# Patient Record
Sex: Female | Born: 1950 | Race: White | Hispanic: No | Marital: Married | Smoking: Former smoker
Health system: Southern US, Community
[De-identification: ages and names within clinical notes are randomized; demographics above are authoritative.]

## PROBLEM LIST (undated history)

## (undated) DIAGNOSIS — F32A Depression, unspecified: Secondary | ICD-10-CM

## (undated) DIAGNOSIS — J449 Chronic obstructive pulmonary disease, unspecified: Secondary | ICD-10-CM

## (undated) DIAGNOSIS — I10 Essential (primary) hypertension: Secondary | ICD-10-CM

## (undated) DIAGNOSIS — J189 Pneumonia, unspecified organism: Secondary | ICD-10-CM

## (undated) DIAGNOSIS — R001 Bradycardia, unspecified: Secondary | ICD-10-CM

## (undated) DIAGNOSIS — M1990 Unspecified osteoarthritis, unspecified site: Secondary | ICD-10-CM

## (undated) DIAGNOSIS — F419 Anxiety disorder, unspecified: Secondary | ICD-10-CM

## (undated) DIAGNOSIS — G629 Polyneuropathy, unspecified: Secondary | ICD-10-CM

## (undated) DIAGNOSIS — Z8719 Personal history of other diseases of the digestive system: Secondary | ICD-10-CM

## (undated) DIAGNOSIS — I2510 Atherosclerotic heart disease of native coronary artery without angina pectoris: Secondary | ICD-10-CM

## (undated) DIAGNOSIS — E11319 Type 2 diabetes mellitus with unspecified diabetic retinopathy without macular edema: Secondary | ICD-10-CM

## (undated) DIAGNOSIS — E119 Type 2 diabetes mellitus without complications: Secondary | ICD-10-CM

## (undated) DIAGNOSIS — K589 Irritable bowel syndrome without diarrhea: Secondary | ICD-10-CM

## (undated) DIAGNOSIS — I5042 Chronic combined systolic (congestive) and diastolic (congestive) heart failure: Secondary | ICD-10-CM

## (undated) DIAGNOSIS — F329 Major depressive disorder, single episode, unspecified: Secondary | ICD-10-CM

## (undated) DIAGNOSIS — E669 Obesity, unspecified: Secondary | ICD-10-CM

## (undated) DIAGNOSIS — K219 Gastro-esophageal reflux disease without esophagitis: Secondary | ICD-10-CM

## (undated) DIAGNOSIS — E039 Hypothyroidism, unspecified: Secondary | ICD-10-CM

## (undated) DIAGNOSIS — N200 Calculus of kidney: Secondary | ICD-10-CM

## (undated) DIAGNOSIS — D509 Iron deficiency anemia, unspecified: Secondary | ICD-10-CM

## (undated) DIAGNOSIS — R1011 Right upper quadrant pain: Secondary | ICD-10-CM

## (undated) DIAGNOSIS — Z941 Heart transplant status: Secondary | ICD-10-CM

## (undated) HISTORY — DX: Type 2 diabetes mellitus without complications: E11.9

## (undated) HISTORY — DX: Atherosclerotic heart disease of native coronary artery without angina pectoris: I25.10

## (undated) HISTORY — DX: Chronic combined systolic (congestive) and diastolic (congestive) heart failure: I50.42

## (undated) HISTORY — PX: COLONOSCOPY: SHX174

## (undated) HISTORY — PX: EYE SURGERY: SHX253

## (undated) HISTORY — DX: Essential (primary) hypertension: I10

## (undated) HISTORY — DX: Obesity, unspecified: E66.9

## (undated) HISTORY — PX: CHOLECYSTECTOMY: SHX55

## (undated) HISTORY — PX: APPENDECTOMY: SHX54

## (undated) HISTORY — DX: Anxiety disorder, unspecified: F41.9

## (undated) HISTORY — DX: Gastro-esophageal reflux disease without esophagitis: K21.9

## (undated) HISTORY — DX: Iron deficiency anemia, unspecified: D50.9

## (undated) HISTORY — DX: Right upper quadrant pain: R10.11

## (undated) HISTORY — DX: Bradycardia, unspecified: R00.1

---

## 1992-12-08 HISTORY — PX: OTHER SURGICAL HISTORY: SHX169

## 1996-12-08 HISTORY — PX: OTHER SURGICAL HISTORY: SHX169

## 1996-12-08 HISTORY — PX: CARDIAC CATHETERIZATION: SHX172

## 1996-12-08 HISTORY — PX: BILATERAL SALPINGOOPHORECTOMY: SHX1223

## 1998-06-02 ENCOUNTER — Emergency Department (HOSPITAL_COMMUNITY): Admission: EM | Admit: 1998-06-02 | Discharge: 1998-06-02 | Payer: Self-pay | Admitting: Emergency Medicine

## 1999-01-15 ENCOUNTER — Inpatient Hospital Stay (HOSPITAL_COMMUNITY): Admission: AD | Admit: 1999-01-15 | Discharge: 1999-01-16 | Payer: Self-pay | Admitting: Cardiovascular Disease

## 1999-01-16 ENCOUNTER — Encounter: Payer: Self-pay | Admitting: Cardiovascular Disease

## 1999-04-26 ENCOUNTER — Inpatient Hospital Stay (HOSPITAL_COMMUNITY): Admission: AD | Admit: 1999-04-26 | Discharge: 1999-04-27 | Payer: Self-pay | Admitting: Cardiology

## 2002-12-22 ENCOUNTER — Encounter: Admission: RE | Admit: 2002-12-22 | Discharge: 2002-12-22 | Payer: Self-pay | Admitting: Internal Medicine

## 2002-12-22 ENCOUNTER — Encounter: Payer: Self-pay | Admitting: Internal Medicine

## 2002-12-27 ENCOUNTER — Encounter: Admission: RE | Admit: 2002-12-27 | Discharge: 2002-12-27 | Payer: Self-pay | Admitting: Internal Medicine

## 2002-12-27 ENCOUNTER — Encounter (INDEPENDENT_AMBULATORY_CARE_PROVIDER_SITE_OTHER): Payer: Self-pay | Admitting: *Deleted

## 2002-12-27 ENCOUNTER — Encounter: Payer: Self-pay | Admitting: Internal Medicine

## 2004-10-02 ENCOUNTER — Inpatient Hospital Stay (HOSPITAL_COMMUNITY): Admission: EM | Admit: 2004-10-02 | Discharge: 2004-10-09 | Payer: Self-pay | Admitting: Cardiovascular Disease

## 2004-10-02 ENCOUNTER — Ambulatory Visit: Payer: Self-pay | Admitting: *Deleted

## 2004-10-08 ENCOUNTER — Encounter: Payer: Self-pay | Admitting: Cardiology

## 2004-10-08 HISTORY — PX: OTHER SURGICAL HISTORY: SHX169

## 2004-10-19 ENCOUNTER — Ambulatory Visit: Payer: Self-pay | Admitting: Cardiovascular Disease

## 2004-10-19 ENCOUNTER — Inpatient Hospital Stay (HOSPITAL_COMMUNITY): Admission: EM | Admit: 2004-10-19 | Discharge: 2004-10-23 | Payer: Self-pay | Admitting: *Deleted

## 2004-10-19 ENCOUNTER — Ambulatory Visit: Payer: Self-pay | Admitting: Cardiology

## 2004-10-21 ENCOUNTER — Encounter: Payer: Self-pay | Admitting: Cardiology

## 2004-11-04 ENCOUNTER — Ambulatory Visit: Payer: Self-pay | Admitting: Internal Medicine

## 2004-11-22 ENCOUNTER — Ambulatory Visit: Payer: Self-pay | Admitting: Cardiovascular Disease

## 2005-01-06 ENCOUNTER — Ambulatory Visit: Payer: Self-pay

## 2005-02-26 ENCOUNTER — Ambulatory Visit: Payer: Self-pay | Admitting: Cardiovascular Disease

## 2005-04-09 ENCOUNTER — Ambulatory Visit: Payer: Self-pay | Admitting: Internal Medicine

## 2005-04-09 ENCOUNTER — Ambulatory Visit: Payer: Self-pay

## 2005-04-22 ENCOUNTER — Ambulatory Visit: Payer: Self-pay

## 2005-04-23 ENCOUNTER — Ambulatory Visit: Payer: Self-pay | Admitting: Physician Assistant

## 2005-04-25 ENCOUNTER — Ambulatory Visit: Payer: Self-pay | Admitting: Cardiology

## 2005-04-25 ENCOUNTER — Ambulatory Visit (HOSPITAL_COMMUNITY): Admission: RE | Admit: 2005-04-25 | Discharge: 2005-04-26 | Payer: Self-pay | Admitting: Cardiology

## 2005-05-12 ENCOUNTER — Ambulatory Visit: Payer: Self-pay | Admitting: Cardiology

## 2005-06-02 ENCOUNTER — Ambulatory Visit: Payer: Self-pay | Admitting: Internal Medicine

## 2005-06-02 ENCOUNTER — Ambulatory Visit: Payer: Self-pay | Admitting: Cardiovascular Disease

## 2005-06-16 ENCOUNTER — Ambulatory Visit: Payer: Self-pay | Admitting: Cardiology

## 2005-09-01 ENCOUNTER — Ambulatory Visit: Payer: Self-pay | Admitting: Internal Medicine

## 2005-09-01 ENCOUNTER — Ambulatory Visit: Payer: Self-pay | Admitting: Cardiovascular Disease

## 2005-09-29 ENCOUNTER — Ambulatory Visit: Payer: Self-pay | Admitting: Cardiology

## 2005-11-25 ENCOUNTER — Ambulatory Visit: Payer: Self-pay

## 2005-12-30 ENCOUNTER — Ambulatory Visit: Payer: Self-pay | Admitting: *Deleted

## 2006-03-16 ENCOUNTER — Ambulatory Visit: Payer: Self-pay | Admitting: Cardiovascular Disease

## 2006-04-14 ENCOUNTER — Ambulatory Visit: Payer: Self-pay

## 2006-04-14 ENCOUNTER — Ambulatory Visit: Payer: Self-pay | Admitting: Cardiovascular Disease

## 2006-04-14 ENCOUNTER — Encounter: Payer: Self-pay | Admitting: Cardiovascular Disease

## 2007-01-19 ENCOUNTER — Ambulatory Visit: Payer: Self-pay | Admitting: Internal Medicine

## 2007-01-19 LAB — CONVERTED CEMR LAB
ALT: 21 units/L (ref 0–40)
AST: 23 units/L (ref 0–37)
Alkaline Phosphatase: 87 units/L (ref 39–117)
BUN: 12 mg/dL (ref 6–23)
Basophils Relative: 1.1 % — ABNORMAL HIGH (ref 0.0–1.0)
CO2: 31 meq/L (ref 19–32)
Creatinine, Ser: 0.8 mg/dL (ref 0.4–1.2)
Direct LDL: 140 mg/dL
HCT: 38.7 % (ref 36.0–46.0)
Hemoglobin: 13.3 g/dL (ref 12.0–15.0)
Microalb, Ur: 0.8 mg/dL (ref 0.0–1.9)
Monocytes Absolute: 0.4 10*3/uL (ref 0.2–0.7)
Monocytes Relative: 5.9 % (ref 3.0–11.0)
Potassium: 3.5 meq/L (ref 3.5–5.1)
RDW: 11.9 % (ref 11.5–14.6)
Total Bilirubin: 0.8 mg/dL (ref 0.3–1.2)
Total Protein: 6.7 g/dL (ref 6.0–8.3)

## 2007-03-08 ENCOUNTER — Ambulatory Visit: Payer: Self-pay | Admitting: Cardiovascular Disease

## 2007-03-08 ENCOUNTER — Ambulatory Visit: Payer: Self-pay | Admitting: Internal Medicine

## 2007-03-26 ENCOUNTER — Ambulatory Visit: Payer: Self-pay

## 2007-06-03 ENCOUNTER — Ambulatory Visit: Payer: Self-pay

## 2007-07-14 ENCOUNTER — Encounter: Payer: Self-pay | Admitting: Internal Medicine

## 2007-09-21 ENCOUNTER — Encounter: Payer: Self-pay | Admitting: Internal Medicine

## 2008-01-21 ENCOUNTER — Encounter: Payer: Self-pay | Admitting: Internal Medicine

## 2008-04-03 ENCOUNTER — Ambulatory Visit: Payer: Self-pay

## 2008-04-03 ENCOUNTER — Ambulatory Visit: Payer: Self-pay | Admitting: Cardiovascular Disease

## 2008-05-18 ENCOUNTER — Telehealth: Payer: Self-pay | Admitting: Internal Medicine

## 2008-06-20 ENCOUNTER — Telehealth: Payer: Self-pay | Admitting: Internal Medicine

## 2008-06-26 ENCOUNTER — Ambulatory Visit: Payer: Self-pay | Admitting: Internal Medicine

## 2008-06-26 DIAGNOSIS — E785 Hyperlipidemia, unspecified: Secondary | ICD-10-CM | POA: Insufficient documentation

## 2008-06-26 DIAGNOSIS — F411 Generalized anxiety disorder: Secondary | ICD-10-CM | POA: Insufficient documentation

## 2008-06-26 DIAGNOSIS — I2510 Atherosclerotic heart disease of native coronary artery without angina pectoris: Secondary | ICD-10-CM | POA: Insufficient documentation

## 2008-06-26 DIAGNOSIS — K219 Gastro-esophageal reflux disease without esophagitis: Secondary | ICD-10-CM

## 2008-06-26 DIAGNOSIS — J309 Allergic rhinitis, unspecified: Secondary | ICD-10-CM | POA: Insufficient documentation

## 2008-06-26 DIAGNOSIS — I10 Essential (primary) hypertension: Secondary | ICD-10-CM | POA: Insufficient documentation

## 2008-06-26 DIAGNOSIS — E119 Type 2 diabetes mellitus without complications: Secondary | ICD-10-CM

## 2008-06-28 LAB — CONVERTED CEMR LAB
ALT: 18 units/L (ref 0–35)
AST: 18 units/L (ref 0–37)
Basophils Absolute: 0.1 10*3/uL (ref 0.0–0.1)
Bilirubin, Direct: 0.1 mg/dL (ref 0.0–0.3)
CO2: 27 meq/L (ref 19–32)
Calcium: 9.3 mg/dL (ref 8.4–10.5)
Chloride: 99 meq/L (ref 96–112)
Cholesterol: 190 mg/dL (ref 0–200)
Direct LDL: 124 mg/dL
Eosinophils Absolute: 0.2 10*3/uL (ref 0.0–0.7)
GFR calc non Af Amer: 69 mL/min
HDL: 39.7 mg/dL (ref >39.0)
Hgb A1c MFr Bld: 11.5 % — ABNORMAL HIGH (ref 4.6–6.0)
Lymphocytes Relative: 14.2 % (ref 12.0–46.0)
MCHC: 34.5 g/dL (ref 30.0–36.0)
Microalb, Ur: 2.6 mg/dL — ABNORMAL HIGH (ref 0.0–1.9)
Neutrophils Relative %: 75.6 % (ref 43.0–77.0)
RDW: 12.4 % (ref 11.5–14.6)
Sodium: 136 meq/L (ref 135–145)
TSH: 1.81 microintl units/mL (ref 0.35–5.50)
Total Bilirubin: 1 mg/dL (ref 0.3–1.2)
Triglycerides: 231 mg/dL (ref 0–149)
VLDL: 46 mg/dL — ABNORMAL HIGH (ref 0–40)

## 2008-06-29 ENCOUNTER — Telehealth: Payer: Self-pay | Admitting: Internal Medicine

## 2008-06-30 ENCOUNTER — Telehealth: Payer: Self-pay | Admitting: Internal Medicine

## 2008-08-07 ENCOUNTER — Encounter: Payer: Self-pay | Admitting: Internal Medicine

## 2008-10-12 ENCOUNTER — Telehealth: Payer: Self-pay | Admitting: Internal Medicine

## 2008-10-31 ENCOUNTER — Encounter: Payer: Self-pay | Admitting: Internal Medicine

## 2009-02-21 DIAGNOSIS — R609 Edema, unspecified: Secondary | ICD-10-CM | POA: Insufficient documentation

## 2009-09-21 ENCOUNTER — Encounter: Payer: Self-pay | Admitting: Internal Medicine

## 2010-01-21 ENCOUNTER — Telehealth: Payer: Self-pay | Admitting: Internal Medicine

## 2010-02-08 ENCOUNTER — Emergency Department (HOSPITAL_COMMUNITY): Admission: EM | Admit: 2010-02-08 | Discharge: 2010-02-08 | Payer: Self-pay | Admitting: Emergency Medicine

## 2010-02-08 ENCOUNTER — Telehealth: Payer: Self-pay | Admitting: Internal Medicine

## 2010-02-22 ENCOUNTER — Ambulatory Visit: Payer: Self-pay | Admitting: Internal Medicine

## 2010-02-22 LAB — CONVERTED CEMR LAB
AST: 14 units/L (ref 0–37)
Albumin: 4.1 g/dL (ref 3.5–5.2)
Alkaline Phosphatase: 95 units/L (ref 39–117)
Basophils Absolute: 0.1 10*3/uL (ref 0.0–0.1)
Basophils Relative: 1 % (ref 0–1)
Bilirubin, Direct: 0.1 mg/dL (ref 0.0–0.3)
Calcium: 9.3 mg/dL (ref 8.4–10.5)
Glucose, Bld: 204 mg/dL — ABNORMAL HIGH (ref 70–99)
Hemoglobin: 12.3 g/dL (ref 12.0–15.0)
Hgb A1c MFr Bld: 10.7 % — ABNORMAL HIGH (ref 4.6–6.1)
Indirect Bilirubin: 0.5 mg/dL (ref 0.0–0.9)
Lymphocytes Relative: 23 % (ref 12–46)
MCHC: 32.6 g/dL (ref 30.0–36.0)
Monocytes Absolute: 0.4 10*3/uL (ref 0.1–1.0)
Neutro Abs: 4.4 10*3/uL (ref 1.7–7.7)
Neutrophils Relative %: 68 % (ref 43–77)
Potassium: 3.8 meq/L (ref 3.5–5.3)
RDW: 13.5 % (ref 11.5–15.5)
Sodium: 138 meq/L (ref 135–145)
Total Bilirubin: 0.6 mg/dL (ref 0.3–1.2)

## 2010-02-26 ENCOUNTER — Telehealth: Payer: Self-pay | Admitting: Internal Medicine

## 2010-03-01 ENCOUNTER — Telehealth: Payer: Self-pay | Admitting: Internal Medicine

## 2010-05-16 ENCOUNTER — Telehealth: Payer: Self-pay

## 2010-05-24 ENCOUNTER — Telehealth: Payer: Self-pay | Admitting: Internal Medicine

## 2010-10-22 ENCOUNTER — Telehealth: Payer: Self-pay | Admitting: Internal Medicine

## 2010-12-08 HISTORY — PX: CORONARY ARTERY BYPASS GRAFT: SHX141

## 2010-12-20 ENCOUNTER — Encounter: Payer: Self-pay | Admitting: Cardiovascular Disease

## 2010-12-23 ENCOUNTER — Encounter: Payer: Self-pay | Admitting: Cardiovascular Disease

## 2010-12-23 ENCOUNTER — Encounter: Payer: Self-pay | Admitting: Internal Medicine

## 2010-12-23 ENCOUNTER — Ambulatory Visit: Admission: RE | Admit: 2010-12-23 | Discharge: 2010-12-23 | Payer: Self-pay | Source: Home / Self Care

## 2010-12-24 ENCOUNTER — Encounter: Payer: Self-pay | Admitting: Cardiovascular Disease

## 2010-12-26 ENCOUNTER — Inpatient Hospital Stay (HOSPITAL_COMMUNITY)
Admission: AD | Admit: 2010-12-26 | Discharge: 2011-01-05 | Payer: Self-pay | Attending: Cardiothoracic Surgery | Admitting: Cardiothoracic Surgery

## 2010-12-30 LAB — CARDIAC PANEL(CRET KIN+CKTOT+MB+TROPI)
CK, MB: 2 ng/mL (ref 0.3–4.0)
Relative Index: INVALID (ref 0.0–2.5)
Relative Index: INVALID (ref 0.0–2.5)
Total CK: 42 U/L (ref 7–177)
Total CK: 37 U/L (ref 7–177)
Troponin I: 0.03 ng/mL (ref 0.00–0.06)

## 2010-12-30 LAB — HEMOGLOBIN A1C
Hgb A1c MFr Bld: 10.5 % — ABNORMAL HIGH (ref <5.7)
Mean Plasma Glucose: 255 mg/dL — ABNORMAL HIGH (ref <117)

## 2010-12-30 LAB — CBC
HCT: 36.3 % (ref 36.0–46.0)
Hemoglobin: 11.8 g/dL — ABNORMAL LOW (ref 12.0–15.0)
MCH: 27.6 pg (ref 26.0–34.0)
MCHC: 32.5 g/dL (ref 30.0–36.0)
MCV: 84.8 fL (ref 78.0–100.0)
Platelets: 279 10*3/uL (ref 150–400)
RBC: 4.28 MIL/uL (ref 3.87–5.11)
RDW: 13.5 % (ref 11.5–15.5)
WBC: 6.8 10*3/uL (ref 4.0–10.5)

## 2010-12-30 LAB — BASIC METABOLIC PANEL
BUN: 3 mg/dL — ABNORMAL LOW (ref 6–23)
Calcium: 9.1 mg/dL (ref 8.4–10.5)
Chloride: 103 mEq/L (ref 96–112)
Creatinine, Ser: 0.83 mg/dL (ref 0.4–1.2)
GFR calc Af Amer: >60 mL/min (ref >60)
GFR calc non Af Amer: >60 mL/min (ref >60)
Glucose, Bld: 129 mg/dL — ABNORMAL HIGH (ref 70–99)
Potassium: 4 mEq/L (ref 3.5–5.1)
Sodium: 138 mEq/L (ref 135–145)

## 2010-12-30 LAB — LIPID PANEL
Cholesterol: 190 mg/dL (ref 0–200)
HDL: 38 mg/dL — ABNORMAL LOW (ref >39)
LDL Cholesterol: 108 mg/dL — ABNORMAL HIGH (ref 0–99)
Total CHOL/HDL Ratio: 5 RATIO
Triglycerides: 222 mg/dL — ABNORMAL HIGH (ref <150)

## 2010-12-30 LAB — HEPARIN LEVEL (UNFRACTIONATED)
Heparin Unfractionated: 0.24 IU/mL — ABNORMAL LOW (ref 0.30–0.70)
Heparin Unfractionated: 0.12 IU/mL — ABNORMAL LOW (ref 0.30–0.70)
Heparin Unfractionated: 0.16 IU/mL — ABNORMAL LOW (ref 0.30–0.70)

## 2010-12-30 LAB — GLUCOSE, CAPILLARY
Glucose-Capillary: 256 mg/dL — ABNORMAL HIGH (ref 70–99)
Glucose-Capillary: 157 mg/dL — ABNORMAL HIGH (ref 70–99)
Glucose-Capillary: 288 mg/dL — ABNORMAL HIGH (ref 70–99)
Glucose-Capillary: 141 mg/dL — ABNORMAL HIGH (ref 70–99)
Glucose-Capillary: 98 mg/dL (ref 70–99)
Glucose-Capillary: 238 mg/dL — ABNORMAL HIGH (ref 70–99)

## 2010-12-30 LAB — PLATELET INHIBITION P2Y12
P2Y12 % Inhibition: 13 %
Platelet Function  P2Y12: 268 [PRU] (ref 194–418)
Platelet Function Baseline: 307 [PRU] (ref 194–418)

## 2010-12-30 NOTE — H&P (Signed)
NAME:  Shelby Lyons, Shelby Lyons NO.:  [id number]  MEDICAL RECORD NO.:  [id number]          PATIENT TYPE:  INP  LOCATION:  2914                         FACILITY:  [facility]  PHYSICIAN:  Luis Abed, MD, FACCDATE OF BIRTH:  [birth year]  DATE OF ADMISSION:  12/26/2010 DATE OF DISCHARGE:                             HISTORY & PHYSICAL   PRIMARY CARDIOLOGIST:  Theron Arista C. Eden Emms, MD, [facility]  PRIMARY CARE PHYSICIAN:  Gordy Savers, MD  CHIEF COMPLAINT:  Chest pain.  PATIENT PROFILE:  This is a 60 year old female with history of coronary artery disease status post multiple percutaneous coronary interventions, hypertension, hyperlipidemia and insulin-dependent diabetes mellitus who presented to [facility] with complaints of chest pain and questionable EKG changes who has now been transferred to [facility] for possible cardiac catheterization.  PAST MEDICAL HISTORY: 1. Coronary artery disease status post anterior wall myocardial     infarction in 1994 with a stent to the proximal LAD.     a.     In October 2005 status post lateral myocardial infarction      with a drug-eluting stent to the mid circumflex artery with a left      distal dissection and subsequent elective stenting of the distal      RCA with tandem overlying Taxus stent in the mid and proximal      right coronary artery.     b.     Cardiac catheterization in November 2005, status post      intervention of the circumflex artery at the site of dissection as      well as 2 bare-metal stents placed to distal and overlapping the      drug-eluting stent in the distal circumflex artery.     c.     Myoview in 2009 showing an ejection fraction of 47% with      thick anterior apical wall infarction. 2. 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 Hypertension. 4. Hyperlipidemia. 5. Systolic congestive heart failure, last ejection fraction per     echocardiogram in 2007 showing LVEF of 45%. 6.  Gastroesophageal reflux disease. 7. Obesity. 8. Anxiety. 9. Status post appendectomy. 10.Status post hysterectomy and bilateral salpingo-oophorectomy.  HISTORY OF PRESENT ILLNESS:  This is a 60 year old Caucasian female with multiple medical problems as listed above who states she has been in her usual state of health until early this morning.  At this time she presented to [facility] with complaints of chest pain.  The patient states that she was not doing anything when she began to have retrosternal chest pressure and soreness around 1:30 a.m.  She felt initially that she was coming down with bronchitis.  This then progressed into a burning sensation.  They remained retrosternal.  She had no associated symptoms of nausea/vomiting, diaphoresis or shortness of breath.  The patient states the pain did feel similar to her prior myocardial infarction.  Therefore, she took 2 sublingual nitroglycerin without relief.  She then went to the emergency department at [facility] for further evaluation.  In the emergency room, the patient was found to have possible ST depressions on her initial  EKG.  These normalized after placement of a nitroglycerin patch.  Dr. Myrtis Ser was contacted and with EKG changes in the patient's past medical history, she was transferred to [facility] for possible cardiac catheterization.  The patient's burning sensation has cleared since placement of nitroglycerin patch.  She now complains of intermittent stabbing sensation in the left chest.  This only lasts momentarily.  She denies any fever, chills or headaches.  She does have dyspnea on exertion.  She also complains of some orthopnea.  Of note, the patient has not been evaluated by cardiologist since 2009. She states that she has a nonhealing ulcer on her foot that she was recently had ABIs for evaluation.  She states she was going to call for followup appointment but this has not occurred.  SOCIAL HISTORY:   The patient lives in [place] with her husband and her son and his family.  She is a retired Geophysicist/field seismologist.  She denies any tobacco, alcohol or illicit drug use.  She has no exercise program.  FAMILY HISTORY:  Significant for coronary artery disease and myocardial infarction in her father who passed away at the age of 22.  She has a sister with hypertension.  ALLERGIES: 1. SULFA. 2. PENICILLIN.  HOME MEDICATIONS: 1. Aspirin 325 mg daily. 2. Albuterol inhaler 2 puffs every 4 hours as needed for shortness of     breath. 3. Humalog 20 units subcutaneously 3 times a day as needed with meals. 4. Lantus 70 units daily at bedtime. 5. Accupril 20 mg 1 tablet daily. 6. Metformin 1000 mg 1 tablet twice daily. 7. Xanax 0.5 mg 1 tablet daily as needed for anxiety. 8. Atorvastatin 80 mg daily at bedtime. 9. Nitroglycerin sublingual 0.4 mg 1 tablet sublingual every 5 minutes     as needed up to 3 doses for chest pain. 10.Toprol-XL 25 mg half tablet twice daily. 11.Plavix 75 mg 1 tablet daily. 12.Zantac 150 mg 1 tablet daily. 13.Potassium chloride 20 mEq 1 tablet daily. 14.Lasix 40 mg 1 tablet twice daily. 15.Reglan 10 mg 1 tablet twice daily. 16.Zoloft 100 mg 1 tablet daily.  REVIEW OF SYSTEMS:  All pertinent positives and negatives as stated in HPI.  All other systems have been reviewed and are negative.  CODE STATUS:  Full.  PHYSICAL EXAMINATION:  VITAL SIGNS:  Pulse 62, respirations 12, blood pressure 120/52, O2 saturation 98% on 2 L. GENERAL:  This is a well-developed, well-nourished polite middle-aged female.  She is in no acute distress. HEENT:  Normal. NECK:  Supple without bruit or JVD. HEART:  Regular rate and rhythm with S1 and S2.  No murmur, rub or gallop noted.  PMI is normal.  Pulses are 2+ and equal bilaterally. LUNGS:  Clear to auscultation bilaterally without wheezes, rales or rhonchi. ABDOMEN:  Obese, nontender, positive bowel sounds x4. EXTREMITIES:  No  clubbing, cyanosis or edema. MUSCULOSKELETAL:  No joint deformities or effusions. NEURO:  Alert and oriented x3.  Cranial nerves II through XII are grossly intact.  EKG at 4 a.m. demonstrating normal sinus rhythm at a rate of 72 beats per minute with anterior septal infarct as well as questionable inferior ST depressions in that appears to J-point lateral elevations.  Of note, the EKG appears noisy.  Followup EKG at 6:00 a.m. demonstrating normal sinus rhythm with normalization of ST depression and J-point elevation. There still has anterior septal infarct noted.  Labs at [place]; WBC 6.7, hemoglobin 12.6, hematocrit 37.7.  Sodium 133, potassium 4.5, chloride 98, bicarb 22, BUN  12, creatinine 0.74, glucose 374.  Point-of-care markers negative x1.  ASSESSMENT AND PLAN:  This is a 60 year old female with coronary artery disease status post multiple percutaneous coronary interventions, insulin-dependent diabetes mellitus, hypertension, hyperlipidemia, and obesity who now presents with chest pain and EKG changes.  With the patient's presentation, EKG changes and prior history, we will proceed with cardiac catheterization to reevaluate coronary anatomy.  Risks, benefits and indications have been discussed with the patient and her husband and they agree to proceed.  The patient has been placed on the cath scheduled for this afternoon.  She will be continued on heparin, nitroglycerin patch, aspirin, Plavix, beta-blocker, ACE inhibitor and statins for her coronary artery disease.  If the patient's cardiac enzymes do come back positive or further chest pain arrives this weekend, initiate a nitroglycerin drip at that time.  Routine followup and medication compliance have been discussed with the patient and her husband, they voiced understanding.  Further treatment will be dependent on the results of the catheterization.     Leonette Monarch, PA-C   ______________________________ Luis Abed, MD, [facility]    NB/MEDQ  D:  12/26/2010  T:  12/26/2010  Job:  [id number]  cc:   Noralyn Pick. Eden Emms, MD, [facility] Gordy Savers, MD  Electronically Signed by Alen Blew P.A. on 12/30/2010 02:39:46 PM Electronically Signed by Willa Rough MD FACC on 12/30/2010 05:20:55 PM

## 2010-12-31 LAB — PREPARE RBC (CROSSMATCH)

## 2010-12-31 LAB — GLUCOSE, CAPILLARY
Glucose-Capillary: 157 mg/dL — ABNORMAL HIGH (ref 70–99)
Glucose-Capillary: 173 mg/dL — ABNORMAL HIGH (ref 70–99)
Glucose-Capillary: 290 mg/dL — ABNORMAL HIGH (ref 70–99)
Glucose-Capillary: 318 mg/dL — ABNORMAL HIGH (ref 70–99)

## 2010-12-31 LAB — BLOOD GAS, ARTERIAL
Acid-Base Excess: 1.1 mmol/L (ref 0.0–2.0)
Bicarbonate: 24.8 mEq/L — ABNORMAL HIGH (ref 20.0–24.0)
FIO2: 0.21 %
O2 Saturation: 94.9 %
Patient temperature: 98.6
TCO2: 25.9 mmol/L (ref 0–100)
pCO2 arterial: 36.5 mmHg (ref 35.0–45.0)
pH, Arterial: 7.446 — ABNORMAL HIGH (ref 7.350–7.400)
pO2, Arterial: 80.2 mmHg (ref 80.0–100.0)

## 2010-12-31 LAB — COMPREHENSIVE METABOLIC PANEL
ALT: 10 U/L (ref 0–35)
AST: 15 U/L (ref 0–37)
CO2: 25 mEq/L (ref 19–32)
Calcium: 8.9 mg/dL (ref 8.4–10.5)
GFR calc Af Amer: >60 mL/min (ref >60)
Sodium: 137 mEq/L (ref 135–145)
Total Protein: 6.3 g/dL (ref 6.0–8.3)

## 2010-12-31 LAB — URINALYSIS, ROUTINE W REFLEX MICROSCOPIC
Hgb urine dipstick: NEGATIVE
Nitrite: NEGATIVE
Protein, ur: NEGATIVE mg/dL
Urobilinogen, UA: 1 mg/dL (ref 0.0–1.0)

## 2010-12-31 LAB — CBC
HCT: 32.5 % — ABNORMAL LOW (ref 36.0–46.0)
HCT: 36.1 % (ref 36.0–46.0)
HCT: 33.7 % — ABNORMAL LOW (ref 36.0–46.0)
HCT: 33.3 % — ABNORMAL LOW (ref 36.0–46.0)
Hemoglobin: 11.3 g/dL — ABNORMAL LOW (ref 12.0–15.0)
Hemoglobin: 10.9 g/dL — ABNORMAL LOW (ref 12.0–15.0)
MCH: 27.5 pg (ref 26.0–34.0)
MCHC: 32.9 g/dL (ref 30.0–36.0)
MCHC: 32.7 g/dL (ref 30.0–36.0)
MCV: 85.5 fL (ref 78.0–100.0)
MCV: 85.5 fL (ref 78.0–100.0)
Platelets: 230 10*3/uL (ref 150–400)
RBC: 3.8 MIL/uL — ABNORMAL LOW (ref 3.87–5.11)
RBC: 3.99 MIL/uL (ref 3.87–5.11)
RDW: 13.4 % (ref 11.5–15.5)
RDW: 13.4 % (ref 11.5–15.5)
WBC: 5.4 10*3/uL (ref 4.0–10.5)
WBC: 6.4 10*3/uL (ref 4.0–10.5)
WBC: 7.5 10*3/uL (ref 4.0–10.5)

## 2010-12-31 LAB — HEPARIN LEVEL (UNFRACTIONATED)
Heparin Unfractionated: 0.71 IU/mL — ABNORMAL HIGH (ref 0.30–0.70)
Heparin Unfractionated: 0.22 IU/mL — ABNORMAL LOW (ref 0.30–0.70)
Heparin Unfractionated: 0.31 IU/mL (ref 0.30–0.70)

## 2010-12-31 LAB — URINE MICROSCOPIC-ADD ON

## 2010-12-31 LAB — PROTIME-INR: Prothrombin Time: 13.1 seconds (ref 11.6–15.2)

## 2011-01-01 LAB — APTT: aPTT: 35 seconds (ref 24–37)

## 2011-01-01 LAB — CBC
HCT: 35.7 % — ABNORMAL LOW (ref 36.0–46.0)
HCT: 23.2 % — ABNORMAL LOW (ref 36.0–46.0)
HCT: 24.3 % — ABNORMAL LOW (ref 36.0–46.0)
HCT: 24.8 % — ABNORMAL LOW (ref 36.0–46.0)
HCT: 26.3 % — ABNORMAL LOW (ref 36.0–46.0)
Hemoglobin: 11.6 g/dL — ABNORMAL LOW (ref 12.0–15.0)
Hemoglobin: 7.8 g/dL — ABNORMAL LOW (ref 12.0–15.0)
Hemoglobin: 8.2 g/dL — ABNORMAL LOW (ref 12.0–15.0)
Hemoglobin: 8.5 g/dL — ABNORMAL LOW (ref 12.0–15.0)
MCH: 28.2 pg (ref 26.0–34.0)
MCH: 28.1 pg (ref 26.0–34.0)
MCH: 28.2 pg (ref 26.0–34.0)
MCHC: 32.5 g/dL (ref 30.0–36.0)
MCHC: 33.6 g/dL (ref 30.0–36.0)
MCHC: 33.7 g/dL (ref 30.0–36.0)
MCHC: 33.1 g/dL (ref 30.0–36.0)
MCHC: 32.3 g/dL (ref 30.0–36.0)
MCV: 83.8 fL (ref 78.0–100.0)
MCV: 83.2 fL (ref 78.0–100.0)
MCV: 87.4 fL (ref 78.0–100.0)
Platelets: 130 10*3/uL — ABNORMAL LOW (ref 150–400)
Platelets: 195 10*3/uL (ref 150–400)
Platelets: 188 10*3/uL (ref 150–400)
Platelets: 212 10*3/uL (ref 150–400)
RBC: 4.21 MIL/uL (ref 3.87–5.11)
RBC: 2.77 MIL/uL — ABNORMAL LOW (ref 3.87–5.11)
RBC: 2.92 MIL/uL — ABNORMAL LOW (ref 3.87–5.11)
RBC: 3.01 MIL/uL — ABNORMAL LOW (ref 3.87–5.11)
RDW: 13.3 % (ref 11.5–15.5)
RDW: 13.5 % (ref 11.5–15.5)
RDW: 14 % (ref 11.5–15.5)
RDW: 14.4 % (ref 11.5–15.5)
WBC: 6.9 10*3/uL (ref 4.0–10.5)
WBC: 9 10*3/uL (ref 4.0–10.5)
WBC: 14.4 10*3/uL — ABNORMAL HIGH (ref 4.0–10.5)

## 2011-01-01 LAB — POCT I-STAT 4, (NA,K, GLUC, HGB,HCT)
Glucose, Bld: 208 mg/dL — ABNORMAL HIGH (ref 70–99)
HCT: 21 % — ABNORMAL LOW (ref 36.0–46.0)
HCT: 24 % — ABNORMAL LOW (ref 36.0–46.0)
HCT: 22 % — ABNORMAL LOW (ref 36.0–46.0)
Hemoglobin: 10.5 g/dL — ABNORMAL LOW (ref 12.0–15.0)
Hemoglobin: 8.2 g/dL — ABNORMAL LOW (ref 12.0–15.0)
Hemoglobin: 8.5 g/dL — ABNORMAL LOW (ref 12.0–15.0)
Hemoglobin: 7.5 g/dL — ABNORMAL LOW (ref 12.0–15.0)
Hemoglobin: 8.5 g/dL — ABNORMAL LOW (ref 12.0–15.0)
Potassium: 3.9 mEq/L (ref 3.5–5.1)
Potassium: 3.6 mEq/L (ref 3.5–5.1)
Potassium: 3.9 mEq/L (ref 3.5–5.1)
Potassium: 3.5 mEq/L (ref 3.5–5.1)
Sodium: 136 mEq/L (ref 135–145)
Sodium: 138 mEq/L (ref 135–145)
Sodium: 140 mEq/L (ref 135–145)
Sodium: 138 mEq/L (ref 135–145)
Sodium: 142 mEq/L (ref 135–145)

## 2011-01-01 LAB — GLUCOSE, CAPILLARY
Glucose-Capillary: 299 mg/dL — ABNORMAL HIGH (ref 70–99)
Glucose-Capillary: 127 mg/dL — ABNORMAL HIGH (ref 70–99)
Glucose-Capillary: 117 mg/dL — ABNORMAL HIGH (ref 70–99)
Glucose-Capillary: 102 mg/dL — ABNORMAL HIGH (ref 70–99)
Glucose-Capillary: 127 mg/dL — ABNORMAL HIGH (ref 70–99)
Glucose-Capillary: 115 mg/dL — ABNORMAL HIGH (ref 70–99)
Glucose-Capillary: 104 mg/dL — ABNORMAL HIGH (ref 70–99)
Glucose-Capillary: 95 mg/dL (ref 70–99)
Glucose-Capillary: 80 mg/dL (ref 70–99)
Glucose-Capillary: 82 mg/dL (ref 70–99)
Glucose-Capillary: 82 mg/dL (ref 70–99)
Glucose-Capillary: 91 mg/dL (ref 70–99)
Glucose-Capillary: 101 mg/dL — ABNORMAL HIGH (ref 70–99)
Glucose-Capillary: 83 mg/dL (ref 70–99)
Glucose-Capillary: 129 mg/dL — ABNORMAL HIGH (ref 70–99)

## 2011-01-01 LAB — POCT I-STAT 3, ART BLOOD GAS (G3+)
Acid-base deficit: 3 mmol/L — ABNORMAL HIGH (ref 0.0–2.0)
Acid-base deficit: 2 mmol/L (ref 0.0–2.0)
Acid-base deficit: 7 mmol/L — ABNORMAL HIGH (ref 0.0–2.0)
Bicarbonate: 21.4 mEq/L (ref 20.0–24.0)
Bicarbonate: 21.6 mEq/L (ref 20.0–24.0)
O2 Saturation: 100 %
O2 Saturation: 100 %
O2 Saturation: 95 %
Patient temperature: 37.8
TCO2: 22 mmol/L (ref 0–100)
TCO2: 23 mmol/L (ref 0–100)
pCO2 arterial: 34.5 mmHg — ABNORMAL LOW (ref 35.0–45.0)
pCO2 arterial: 43.5 mmHg (ref 35.0–45.0)
pH, Arterial: 7.406 — ABNORMAL HIGH (ref 7.350–7.400)
pH, Arterial: 7.33 — ABNORMAL LOW (ref 7.350–7.400)
pH, Arterial: 7.346 — ABNORMAL LOW (ref 7.350–7.400)
pO2, Arterial: 257 mmHg — ABNORMAL HIGH (ref 80.0–100.0)
pO2, Arterial: 257 mmHg — ABNORMAL HIGH (ref 80.0–100.0)
pO2, Arterial: 151 mmHg — ABNORMAL HIGH (ref 80.0–100.0)
pO2, Arterial: 85 mmHg (ref 80.0–100.0)

## 2011-01-01 LAB — CREATININE, SERUM
Creatinine, Ser: 0.82 mg/dL (ref 0.4–1.2)
Creatinine, Ser: 1.13 mg/dL (ref 0.4–1.2)
GFR calc Af Amer: >60 mL/min (ref >60)
GFR calc Af Amer: 59 mL/min — ABNORMAL LOW (ref >60)
GFR calc non Af Amer: >60 mL/min (ref >60)
GFR calc non Af Amer: 49 mL/min — ABNORMAL LOW (ref >60)

## 2011-01-01 LAB — TYPE AND SCREEN
ABO/RH(D): O POS
Unit division: 0
Unit division: 0
Unit division: 0

## 2011-01-01 LAB — MAGNESIUM
Magnesium: 2.3 mg/dL (ref 1.5–2.5)
Magnesium: 2.5 mg/dL (ref 1.5–2.5)
Magnesium: 2.6 mg/dL — ABNORMAL HIGH (ref 1.5–2.5)

## 2011-01-01 LAB — HEPARIN LEVEL (UNFRACTIONATED): Heparin Unfractionated: 0.4 IU/mL (ref 0.30–0.70)

## 2011-01-01 LAB — BASIC METABOLIC PANEL
BUN: 18 mg/dL (ref 6–23)
CO2: 23 mEq/L (ref 19–32)
CO2: 22 mEq/L (ref 19–32)
Calcium: 7.7 mg/dL — ABNORMAL LOW (ref 8.4–10.5)
Calcium: 7.8 mg/dL — ABNORMAL LOW (ref 8.4–10.5)
Chloride: 105 mEq/L (ref 96–112)
Creatinine, Ser: 0.83 mg/dL (ref 0.4–1.2)
Creatinine, Ser: 1.1 mg/dL (ref 0.4–1.2)
GFR calc Af Amer: >60 mL/min (ref >60)
GFR calc Af Amer: >60 mL/min (ref >60)
GFR calc non Af Amer: >60 mL/min (ref >60)
GFR calc non Af Amer: 51 mL/min — ABNORMAL LOW (ref >60)
Glucose, Bld: 78 mg/dL (ref 70–99)
Glucose, Bld: 307 mg/dL — ABNORMAL HIGH (ref 70–99)
Potassium: 4.6 mEq/L (ref 3.5–5.1)
Sodium: 133 mEq/L — ABNORMAL LOW (ref 135–145)

## 2011-01-01 LAB — HEMOGLOBIN AND HEMATOCRIT, BLOOD: Hemoglobin: 7.4 g/dL — ABNORMAL LOW (ref 12.0–15.0)

## 2011-01-01 LAB — PROTIME-INR
INR: 1.32 (ref 0.00–1.49)
Prothrombin Time: 16.6 seconds — ABNORMAL HIGH (ref 11.6–15.2)

## 2011-01-01 LAB — PLATELET COUNT: Platelets: 167 10*3/uL (ref 150–400)

## 2011-01-02 LAB — GLUCOSE, CAPILLARY
Glucose-Capillary: 122 mg/dL — ABNORMAL HIGH (ref 70–99)
Glucose-Capillary: 156 mg/dL — ABNORMAL HIGH (ref 70–99)
Glucose-Capillary: 191 mg/dL — ABNORMAL HIGH (ref 70–99)
Glucose-Capillary: 86 mg/dL (ref 70–99)

## 2011-01-02 LAB — CBC
HCT: 23.9 % — ABNORMAL LOW (ref 36.0–46.0)
Hemoglobin: 7.8 g/dL — ABNORMAL LOW (ref 12.0–15.0)
MCH: 28.6 pg (ref 26.0–34.0)
MCHC: 32.6 g/dL (ref 30.0–36.0)
MCV: 87.5 fL (ref 78.0–100.0)
Platelets: 194 10*3/uL (ref 150–400)
RBC: 2.73 MIL/uL — ABNORMAL LOW (ref 3.87–5.11)
RDW: 14.5 % (ref 11.5–15.5)
WBC: 12.7 10*3/uL — ABNORMAL HIGH (ref 4.0–10.5)

## 2011-01-02 LAB — BASIC METABOLIC PANEL
BUN: 19 mg/dL (ref 6–23)
Creatinine, Ser: 1.07 mg/dL (ref 0.4–1.2)
GFR calc Af Amer: >60 mL/min (ref >60)
GFR calc non Af Amer: 52 mL/min — ABNORMAL LOW (ref >60)
Potassium: 3.9 mEq/L (ref 3.5–5.1)

## 2011-01-02 NOTE — Procedures (Addendum)
NAME:  Shelby, Lyons NO.:  [id number]  MEDICAL RECORD NO.:  [id number]          PATIENT TYPE:  INP  LOCATION:  2914                         FACILITY:  [facility]  PHYSICIAN:  Arturo Morton. Riley Kill, MD, FACCDATE OF BIRTH:  1951/06/21  DATE OF PROCEDURE:  12/26/2010 DATE OF DISCHARGE:                           CARDIAC CATHETERIZATION   INDICATIONS:  Shelby Lyons is a 60 year old who presents with unstable angina.  She has had multiple stents in the past involving both the circumflex and right coronary arteries.  Current studies done to assess coronary anatomy.  PROCEDURE: 1. Left heart catheterization. 2. Selective coronary arteriography. 3. Selective left ventriculography.  DESCRIPTION OF PROCEDURE:  The procedure was performed from the right radial artery.  A 5-French sheath was used, 3 mg of intra-arterial verapamil and 150 mcg of intra-arterial nitroglycerin were utilized to dilate the vessel.  4500 units of intravenous heparin was given for the procedure.  She tolerated the procedure without complication.  She was taken to the holding area in satisfactory clinical condition after placement of the TR band.  I then reviewed the findings with her family, and called the cardiovascular surgeons for consult.  HEMODYNAMIC DATA: 1. The central aortic pressure was 120/69, mean 89. 2. LV pressure 157/20. 3. There was no gradient on pullback across the aortic valve.  She     tolerated the procedure well.  ANGIOGRAPHIC DATA: 1. The left main is free of critical disease. 2. The left anterior descending artery has a segmental plaque of about     90%, which is eccentric just prior to the takeoff of the diagonal.     The diagonal ostium is involved, with about 70-80% narrowing of the     vessel, small in caliber.  The LAD beyond this has about 70% area     of eccentric narrowing and then following the second diagonal     takeoff, there is diffuse plaque probably 40-50% and  the vessel was     totally occluded distally.  It picks up with an apical portion at     the apex. 3. There is a small ramus intermedius.  The ramus may have a small cut     off, but the distal vessel was not visualized through collaterals.     The AV circumflex is extensively stented.  There is an area of high-     grade in-stent restenosis prior to the distal bifurcation.  The     distal bifurcation has about 60% narrowing in the superior branch     and the inferior branch is widely patent, both of which are very     suitable for grafting. 4. The right coronary artery is an extensively stented vessel.  There     is no high-grade narrowing proximally with mild luminal     irregularity and a long area of stenting throughout the midvessel.     In the midvessel, there is about 60% narrowing followed by 70%     narrowing just after the acute margin.  After this in the stent,     there is a subtotal occlusion distally, the  stent and the proximal     PDA picks up.  The proximal PDA has about 80% segmental plaquing.     The posterolateral branch appears to be graftable as well.  Ventriculography in the RAO projection reveals overall ejection fraction in the range of about 45-50%.  There is anteroapical hypokinesis.  CONCLUSION: 1. Mild moderate reduction in left ventricular systolic function. 2. Advanced three-vessel coronary artery disease with previous     stenting of the circumflex on right with evidence of in-stent     restenosis as well as progression of disease in the LAD system.  DISPOSITION:  At the present time, my leaning would be in the direction of revascularization surgery.  The extent of her disease is lot.  It would require multiple dilatations.  She is a good candidate for surgery.  Surgical consultation will be obtained.     Arturo Morton. Riley Kill, MD, [facility]     TDS/MEDQ  D:  12/26/2010  T:  12/27/2010  Job:  [id number]  cc:   Noralyn Pick. Eden Emms, MD, [facility] Gordy Savers, MD  Electronically Signed by Shawnie Pons MD [facility] on 01/02/2011 04:48:58 AM

## 2011-01-03 LAB — BASIC METABOLIC PANEL
BUN: 18 mg/dL (ref 6–23)
CO2: 25 mEq/L (ref 19–32)
Calcium: 8.1 mg/dL — ABNORMAL LOW (ref 8.4–10.5)
Chloride: 102 mEq/L (ref 96–112)
Creatinine, Ser: 1.02 mg/dL (ref 0.4–1.2)
GFR calc Af Amer: >60 mL/min (ref >60)
GFR calc non Af Amer: 55 mL/min — ABNORMAL LOW (ref >60)
Glucose, Bld: 198 mg/dL — ABNORMAL HIGH (ref 70–99)
Potassium: 4 mEq/L (ref 3.5–5.1)
Sodium: 136 mEq/L (ref 135–145)

## 2011-01-03 LAB — CBC
HCT: 24.7 % — ABNORMAL LOW (ref 36.0–46.0)
Hemoglobin: 7.9 g/dL — ABNORMAL LOW (ref 12.0–15.0)
MCH: 27.5 pg (ref 26.0–34.0)
MCHC: 32 g/dL (ref 30.0–36.0)
MCV: 86.1 fL (ref 78.0–100.0)
Platelets: 250 10*3/uL (ref 150–400)
RBC: 2.87 MIL/uL — ABNORMAL LOW (ref 3.87–5.11)
RDW: 14 % (ref 11.5–15.5)
WBC: 10.2 10*3/uL (ref 4.0–10.5)

## 2011-01-03 LAB — GLUCOSE, CAPILLARY: Glucose-Capillary: 73 mg/dL (ref 70–99)

## 2011-01-04 LAB — GLUCOSE, CAPILLARY
Glucose-Capillary: 252 mg/dL — ABNORMAL HIGH (ref 70–99)
Glucose-Capillary: 151 mg/dL — ABNORMAL HIGH (ref 70–99)

## 2011-01-05 LAB — GLUCOSE, CAPILLARY
Glucose-Capillary: 97 mg/dL (ref 70–99)
Glucose-Capillary: 150 mg/dL — ABNORMAL HIGH (ref 70–99)

## 2011-01-05 LAB — CBC
HCT: 25.4 % — ABNORMAL LOW (ref 36.0–46.0)
Hemoglobin: 8 g/dL — ABNORMAL LOW (ref 12.0–15.0)
MCH: 27.7 pg (ref 26.0–34.0)
MCHC: 31.5 g/dL (ref 30.0–36.0)
MCV: 87.9 fL (ref 78.0–100.0)
Platelets: 312 10*3/uL (ref 150–400)
RBC: 2.89 MIL/uL — ABNORMAL LOW (ref 3.87–5.11)
RDW: 14 % (ref 11.5–15.5)
WBC: 7 10*3/uL (ref 4.0–10.5)

## 2011-01-06 LAB — GLUCOSE, CAPILLARY
Glucose-Capillary: 249 mg/dL — ABNORMAL HIGH (ref 70–99)
Glucose-Capillary: 139 mg/dL — ABNORMAL HIGH (ref 70–99)

## 2011-01-07 NOTE — Op Note (Signed)
NAME:  Shelby Lyons, Shelby Lyons NO.:  [id number]  MEDICAL RECORD NO.:  [id number]          PATIENT TYPE:  INP  LOCATION:  2308                         FACILITY:  [facility]  PHYSICIAN:  Kerin Perna, M.D.  DATE OF BIRTH:  07/29/1951  DATE OF PROCEDURE:  12/31/2010 DATE OF DISCHARGE:                              OPERATIVE REPORT   OPERATION: 1. Coronary artery bypass grafting x3 (left internal mammary artery to     left anterior descending, saphenous vein graft to circumflex     marginal, saphenous vein graft to posterior descending). 2. Endoscopic harvest of right leg greater saphenous vein.  SURGEON:  Kerin Perna, M.D.  ASSISTANT:  Salvatore Decent. Dorris Fetch, M.D. and Coral Ceo, P.A.  ANESTHESIA:  General.  PREOPERATIVE DIAGNOSIS:  Severe multivessel coronary artery disease with unstable angina.  POSTOPERATIVE DIAGNOSIS:  Severe multivessel coronary artery disease with unstable angina.  INDICATIONS:  The patient is a 60 year old obese diabetic nonsmoker with previous percutaneous intervention multiple times of severe three-vessel disease.  She presented with symptoms of unstable angina and a recap demonstrated in-stent stenosis.  Her past history is remarkable for a previous anterior apical MI.  Otherwise, her EF is approximately 50%. She is felt to be a candidate for surgical revascularization after a Plavix washout.  Prior to surgery, I reviewed the results of cardiac cath with the patient and her family, and discussed the indications, benefits, and risks of coronary artery bypass grafting for treatment of her severe coronary artery disease.  She understood the alternatives of surgical therapy as well.  We discussed the major issues of surgery including the location of the surgical incisions, the use of general anesthesia and cardiopulmonary bypass, and the expected postoperative hospital recovery.  I discussed with the patient risks to her coronary  artery bypass surgery including risks of MI, stroke, bleeding, blood transfusion requirement, infection, and death.  After reviewing these issues, she demonstrated her understanding and agreed to proceed with the surgery under what I felt was an informed consent.  OPERATIVE FINDINGS: 1. Severe diffuse diabetic type disease.  The bypass grafts to the     circumflex and right coronary placed just distal to the stents.     The vessel quality beyond the area of the anastomoses is suboptimal     for further grafting. 2. The LAD had diffuse disease and the LAD distal to the site of the     anastomosis would be suboptimal for future grafting. 3. Apical fibrosis was noted. 4. No intraoperative blood products were administered.  PROCEDURE:  The patient was brought to the operating room, placed supine on the operating room table.  General anesthesia was induced.  A transesophageal 2-D echo probe was placed by the anesthesiologist. Chest, abdomen, and legs were prepped with Betadine and draped as a sterile field.  A sternal incision was made as the saphenous vein was harvested endoscopically from the right leg.  The left internal mammary artery was harvested as a pedicle graft from its origin at the subclavian vessels.  Harvest of the vessel was difficult due to the patient's short stature and obese body  habitus.  However, the vessel was 1.5 mm and had excellent flow.  The sternal retractor was placed using the deep blades due to the patient's body habitus.  The pericardium was opened and suspended. After the vein was harvested and inspected, the patient was given heparin and pursestrings were placed in the ascending aorta and right atrium.  The patient was then cannulated and placed on cardiopulmonary bypass after the ACT was documented as being therapeutic.  The coronaries were identified for grafting and the mammary artery and vein grafts were prepared for the distal anastomoses.   Cardioplegic catheters were placed for both antegrade and retrograde cold blood cardioplegia and the patient was cooled to 32 degrees.  The aortic crossclamp was applied.  800 mL of cold blood cardioplegia was delivered in split doses between the antegrade aortic and retrograde coronary sinus catheters. There is good cardioplegic arrest and septal temperature dropped to less than 12 degrees.  Cardioplegia was delivered every 20 minutes or less while the crossclamp was in place.  The distal coronary anastomoses were then performed.  The first distal anastomosis was to the posterior descending.  This was extended up to the bifurcation of the right posterior descending.  The vessel itself was a 1.5-mm vessel with heavy plaque and calcification and a reverse saphenous vein was sewn end-to-side with running 7-0 Prolene with good flow through the graft.  The second distal anastomosis was the distal circumflex.  This again was placed distal to the previously placed stents.  The vessel was heavily diseased and was 1.4 mm in diameter. Reverse saphenous vein was sewn end-to-side with running 7-0 Prolene with good flow through the graft.  Cardioplegia was redosed.  The third distal anastomosis was the LAD mid septum.  It was a 1.2-mm vessel with heavy proximal disease.  The left IMA pedicle was brought through an opening created in the left lateral pericardium, was brought down on the LAD and sewn end-to-side with running 8-0 Prolene.  There was good flow through the anastomosis after briefly releasing the pedicle bulldog on the mammary artery.  The bulldog was reapplied and the pedicle was secured to the epicardium with 6-0 Prolene.  Cardioplegia was redosed.  While the crossclamp was still in place, two proximal vein anastomoses were performed on the ascending aorta using a 4.0-mm punch running 7-0 Prolene.  Prior to removing the crossclamp, air was vented from the coronaries with a dose of  retrograde warm blood cardioplegia.  After the crossclamp was removed, the heart resumed a spontaneous rhythm.  Air was aspirated from the vein grafts with 27 gauge needle. The cardioplegia catheters were removed.  The proximal and distal anastomoses were checked and found to be hemostatic.  The grafts had good flow.  The patient was rewarmed to 37 degrees and temporary pacing wires were applied.  The patient was rewarmed to 37 degrees and the ventilator was resumed.  The patient was weaned off bypass without difficulty on low-dose dobutamine.  Protamine was administered without adverse reaction.  The cannulas removed and the mediastinum was irrigated with warm saline.  The ACT return normalized and the hemoglobin was 8 grams.  Hemostasis was obtained.  The superior pericardial fat was closed over the aorta.  Two mediastinal and left pleural chest tube were placed and brought out through separate incisions. The sternum was closed with interrupted steel wire.  The pectoralis fascia was closed with a running #1 Vicryl.  Subcutaneous and skin layers were closed with running Vicryl and sterile  dressings were applied.  Total cardiopulmonary bypass time was 101 minutes.     Kerin Perna, M.D.     PV/MEDQ  D:  12/31/2010  T:  01/01/2011  Job:  [id number]  cc:   Arturo Morton. Riley Kill, MD, [facility]  Electronically Signed by Kerin Perna M.D. on 01/07/2011 01:30:26 PM

## 2011-01-07 NOTE — Progress Notes (Signed)
Summary: REFILLS  Phone Note Refill Request Message from:  Fax from Pharmacy  Refills Requested: Medication #1:  LIPITOR 80 MG TABS 1 once daily  Medication #2:  FUROSEMIDE 40 MG TABS 1 two times a day [facility] FAX---[phone]  Initial call taken by: Warnell Forester,  February 26, 2010 2:12 PM    Prescriptions: LIPITOR 80 MG TABS (ATORVASTATIN CALCIUM) 1 once daily  #90 Tablet x 4   Entered by:   Duard Brady LPN   Authorized by:   Gordy Savers  MD   Signed by:   Duard Brady LPN on 98/10/9146   Method used:   Electronically to        [facility]* (mail-order)             ,          Ph: [phone]       Fax: [phone]   RxID:   [id number] FUROSEMIDE 40 MG TABS (FUROSEMIDE) 1 two times a day  #90 Tablet x 4   Entered by:   Duard Brady LPN   Authorized by:   Gordy Savers  MD   Signed by:   Duard Brady LPN on 36/64/4034   Method used:   Electronically to        [facility]* (mail-order)             ,          Ph: [phone]       Fax: [phone]   RxID:   [id number]

## 2011-01-07 NOTE — Consult Note (Signed)
NAME:  Shelby Lyons, Shelby Lyons NO.:  [id number]  MEDICAL RECORD NO.:  [id number]           PATIENT TYPE:  LOCATION:                                 FACILITY:  PHYSICIAN:  Kerin Perna, M.D.  DATE OF BIRTH:  1951-03-25  DATE OF CONSULTATION:  12/27/2010 DATE OF DISCHARGE:                                CONSULTATION   REQUESTING PHYSICIAN:  Arturo Morton. Riley Kill, MD, [facility]  PRIMARY CARE PHYSICIAN:  Gordy Savers, MD  PRIMARY CARDIOLOGIST:  Noralyn Pick. Eden Emms, MD, [facility]  REASON FOR CONSULTATION:  Severe multivessel coronary artery disease with recurrent coronary stenosis status post PCI.  HISTORY OF PRESENT ILLNESS:  I was asked to evaluate this 60 year old Caucasian diabetic female nonsmoker who presented to the hospital with unstable angina and ST-segment depression but negative cardiac enzymes. She is from [place] and has had a long history of coronary artery disease.  In 1994, she presented with an anterior MI and had a PTCA of the LAD.  In 2005, she presented with a acute MI and had a drug-eluting stent placed to the circumflex and subsequent stenting of the distal RCA with drug-eluting stents.  In 2009, she had an ejection fraction of 47% and negative Cardiolite scan for ischemia showing anterior apical scarring.  Since 2009, she has done well on Plavix, atorvastatin, Toprol- XL, and Accupril and aspirin.  The pain that led to her admission occurred at 2:00 a.m. and was substernal pressure and soreness.  There was no associated nausea or diaphoresis or shortness of breath.  Cardiac catheterization performed yesterday by Dr. Riley Kill demonstrated severe progressive multivessel coronary disease.  Her proximal LAD has a 90% stenosis.  The circumflex stents have in-stent stenosis of 80% with a graftable distal vessel.  The right coronary stent has an in-stent stenosis of 80% with a graftable posterior descending.  There was hypokinesia of the anterior wall with  EF of 45-50%.  LVEDP was 20 mmHg. There is no evidence of aortic stenosis or mitral regurgitation.  Based on her coronary anatomy and symptoms, she is felt to be candidate for surgical revascularization after Plavix washout.  She has had pre-CABG Dopplers which shows no carotid stenosis and her ABIs were 1.2 with equal bilateral brachial artery pressures.  PAST MEDICAL HISTORY: 1. Insulin-dependent diabetes, complicated by retinopathy. 2. Hypertension. 3. Dyslipidemia. 4. EF of 45%. 5. GERD. 6. Obesity, weight 220 pounds, 5 feet 6 inches. 7. Anxiety disorder. 8. Surgical history significant for appendectomy and hysterectomy.  MEDICATIONS: 1. Aspirin 325 mg a day. 2. Albuterol q.i.d. p.r.n. 3. Humalog 20 units subcu t.i.d. with meals. 4. Lantus 70 units nightly. 5. Accupril 20 mg daily. 6. Metformin 1 g b.i.d. 7. Xanax 0.5 b.i.d. p.r.n. 8. Atorvastatin 80 mg nightly. 9. Nitroglycerin p.r.n. 10.Toprol-XL 25 mg daily. 11.Plavix 75 mg daily. 12.Lasix 40 mg b.i.d. 13.Reglan 10 mg b.i.d. 14.Zoloft 100 mg daily. 15.Potassium 20 mEq a day.  ALLERGIES:  SULFA and PENICILLIN.  SOCIAL HISTORY:  She is retired Geophysicist/field seismologist.  She lives in [place] with her husband.  She denies tobacco or alcohol.  FAMILY HISTORY:  Positive for  coronary disease and MI in her father who died at age 66 of CAD.  FAMILY HISTORY:  Positive for hypertension and diabetes.  REVIEW OF SYSTEMS:  Constitutional review is negative fever or weight loss.  ENT review is negative for dental complaints or difficulty swallowing.  Thoracic review is negative for history of chest trauma, her recent chest x-ray shows no active disease.  Cardiac review is positive for coronary disease and previous stents.  She denies history of murmur, arrhythmia, or rheumatic heart disease.  GI review is positive for GERD, negative for hepatitis, jaundice, blood per rectum. Neurologic review is negative for kidney stones.   Endocrine review is positive for diabetes, negative for thyroid disease.  Vascular review is negative DVT, claudication, or TIA.  Neurologic review is negative for stroke or seizure.  Hematologic review is negative for bleeding disorder, blood transfusion.  PHYSICAL EXAMINATION:  VITAL SIGNS:  The patient is 5 feet 6 inches, weighs 220 pounds.  Blood pressure is 124/60; O2 saturation 97% on 2 liters; temperature 97 degrees; heart rate 73, sinus rhythm.  GENERAL APPEARANCE:  A middle-aged Caucasian obese female in the CCU, anxious but in no acute distress. HEENT:  Normocephalic.  Dentition good. NECK:  Without JVD, mass, or bruit. LYMPHATICS:  No palpable supraclavicular or cervical adenopathy. THORAX:  Without deformity.  Breath sounds are clear and equal. CARDIAC:  Regular rhythm without S3 gallop or murmur. ABDOMEN:  Obese, soft, nontender without pulsatile mass. EXTREMITIES:  No clubbing, cyanosis, or edema.  Peripheral pulses are 1+. NEUROLOGIC:  Alert and oriented without focal motor deficit.  LABORATORY DATA:  I reviewed her coronary arteriograms and her chest x- ray.  She has severe multivessel disease with a graftable distal vessels to the LAD, circumflex, marginal, and posterior descending.  She will be scheduled for surgery in 5 days after Plavix washout on January 2004.  I discussed the details of surgery with her and her husband.  She understands and agrees to proceed.  Thank for the consultation.     Kerin Perna, M.D.     PV/MEDQ  D:  12/27/2010  T:  12/28/2010  Job:  [id number]  Electronically Signed by Kerin Perna M.D. on 01/07/2011 01:30:17 PM
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
## 2011-01-07 NOTE — Progress Notes (Signed)
Summary: refill alprazolam  Phone Note Refill Request Message from:  Fax from Pharmacy  refill request for alpraozlam 0.5mg  1 by mouth once daily - written 04/06/2009 last filled 09/14/2009  [facility] [address] [place]    Method Requested: Fax to Local Pharmacy Initial call taken by: Duard Brady LPN,  October 22, 2010 12:24 PM  Follow-up for Phone Call        I do not see rx in past or current med list - please advise. KIK  Additional Follow-up for Phone Call Additional follow up Details #1::        med list changed - faxed new rx to [facility]. KIK Additional Follow-up by: Duard Brady LPN,  October 22, 2010 5:12 PM    New/Updated Medications: ALPRAZOLAM 0.5 MG TABS (ALPRAZOLAM) 1 by mouth once daily prn Prescriptions: ALPRAZOLAM 0.5 MG TABS (ALPRAZOLAM) 1 by mouth once daily prn  #90 x 2   Entered by:   Duard Brady LPN   Authorized by:   Gordy Savers  MD   Signed by:   Duard Brady LPN on 16/09/9603   Method used:   Historical   RxID:   [id number]  #90  RF 2
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
## 2011-01-07 NOTE — Progress Notes (Signed)
Summary: REQ FOR MED - rf completed  Phone Note Call from Patient   Caller: Patient Reason for Call: Refill Medication Summary of Call: Pt called to make appt for med ck / refills which was scheduled for 01/29/2010 @ 11:30am w/ Dr Kirtland Bouchard...... Pt adv that she is currently out of med: Accupril 20 mg and would like to have a refill RX (for enough to do her till her appt) sent into [facility] in [place], [place].  Initial call taken by: Debbra Riding,  January 21, 2010 11:28 AM    Prescriptions: ACCUPRIL 20 MG  TABS (QUINAPRIL HCL) 1 once daily  #30 x 0   Entered by:   Duard Brady LPN   Authorized by:   Gordy Savers  MD   Signed by:   Duard Brady LPN on 57/84/6962   Method used:   Electronically to        [facility]  [address] (retail)       [address]       [place], [place]  [address]       Ph: [phone]       Fax: [phone]   RxID:   [id number]

## 2011-01-07 NOTE — Progress Notes (Signed)
Summary: No call No show 3 mos rov   No Call No Show -3 mos  rov   attempt to call - no ans no mach. KIK

## 2011-01-07 NOTE — Progress Notes (Signed)
Summary: furosemide  Phone Note From Pharmacy   Caller: [facility] Summary of Call: Need to verify directions & quantity furosemide. Rx electronic dated 3-22.  Call back to [phone] option2 ref#[phone]. Initial call taken by: Rudy Jew, RN,  March 01, 2010 10:18 AM  Follow-up for Phone Call        Phone call completed Follow-up by: Rudy Jew, RN,  March 01, 2010 12:46 PM    Prescriptions: FUROSEMIDE 40 MG TABS (FUROSEMIDE) 1 two times a day  #180 x 4   Entered by:   Rudy Jew, RN   Authorized by:   Gordy Savers  MD   Signed by:   Rudy Jew, RN on 03/01/2010   Method used:   Historical   RxID:   [id number]  40 mg two times a day  PK

## 2011-01-07 NOTE — Progress Notes (Signed)
Summary: need results  Phone Note Call from Patient Call back at Home Phone [phone]   Caller: Patient----live call Reason for Call: Lab or Test Results Summary of Call: Need her a1c results. Initial call taken by: Warnell Forester,  May 16, 2010 12:08 PM  Follow-up for Phone Call        called pt - 10.7 in march - transfered to scheduling to make rov appt. KIK Follow-up by: Duard Brady LPN,  May 16, 980 12:23 PM

## 2011-01-09 NOTE — Discharge Summary (Signed)
NAME:  Shelby Lyons, Shelby Lyons NO.:  [id number]  MEDICAL RECORD NO.:  [id number]          PATIENT TYPE:  INP  LOCATION:  2001                         FACILITY:  [facility]  PHYSICIAN:  Kerin Perna, M.D.  DATE OF BIRTH:  1951/11/24  DATE OF ADMISSION:  12/26/2010 DATE OF DISCHARGE:                              DISCHARGE SUMMARY   FINAL DIAGNOSIS:  Severe multivessel coronary artery disease with unstable angina.  IN-HOSPITAL DIAGNOSES: 1. Volume overload postoperatively. 2. Acute blood loss anemia postoperatively.  SECONDARY DIAGNOSES: 1. Insulin-dependent diabetes, complicated by retinopathy. 2. Hypertension. 3. Dyslipidemia. 4. Gastroesophageal reflux disease. 5. Obesity. 6. Anxiety disorder. 7. Status post appendectomy. 8. Status post hysterectomy.  IN-HOSPITAL OPERATIONS AND PROCEDURES: 1. Cardiac catheterization done on December 26, 2010, by Dr. Riley Kill. 2. Coronary artery bypass grafting x3 using a left internal mammary     artery to left anterior descending coronary artery, saphenous vein     graft to circumflex marginal, saphenous vein graft to posterior     descending.  Endoscopic vein harvest of the right leg done.  This     was done by Dr. Donata Clay on December 31, 2010. 3. Intraoperative transesophageal echocardiogram done by Dr. Noreene Larsson on     December 31, 2010.  HISTORY AND PHYSICAL AND HOSPITAL COURSE:  The patient is a 60 year old obese diabetic nonsmoker with previous percutaneous intervention multiple times for severe three-vessel disease.  The patient presented with symptoms of unstable angina.  Her past history is remarkable for a previous anterior apical MI.  The patient presented to [facility] with complaints of chest pain and questionable EKG changes and then was transferred to [facility] for possible cardiac catheterization on December 26, 2010.  She was admitted under Dr. Myrtis Ser.  For further details of the patient's past medical history and  physical exam, please see dictated H and P.  The patient was admitted to [facility] on December 26, 2010, for cardiac catheterization.  She was taken to the cath lab by Dr. Riley Kill on December 26, 2010, which showed advanced three-vessel coronary artery disease with previous stenting of the circumflex on the right with evidence of in-stent restenosis as well as progression of disease in the LAD system.  There was mild-to-moderate reduction of left ventricular systolic function.  Following cardiac catheterization, Dr. Donata Clay was consulted.  Dr. Donata Clay saw and evaluated the patient.  He discussed with the patient undergoing coronary artery bypass grafting.  He discussed risks and benefits with the patient.  The patient acknowledged her understanding and agreed to proceed.  The patient had received Plavix and felt that she would remained stable with wait several days for Plavix washout.  She was ultimately scheduled for surgery for December 31, 2010.  Preoperatively, the patient underwent bilateral carotid duplex ultrasound that showed no significant ICA stenosis.  She remained stable preoperatively.  The patient was taken to the operating room on December 31, 2010, where she underwent coronary artery bypass grafting x3 using a left internal mammary artery to left anterior descending, saphenous vein graft to circumflex marginal, saphenous vein graft to posterior descending. Endoscopic vein  harvest of the right leg was done.  The patient tolerated this procedure well and was transferred to the intensive care unit in stable condition.  Postoperatively, the patient was noted to be hemodynamically stable.  She was extubated evening of surgery. Postextubation, the patient noted to be alert and oriented x4, neuro intact.  Postoperatively, the patient was noted to be in normal sinus rhythm.  Blood pressure was stable.  All drips were able to be weaned and discontinued.  She was  started on low-dose beta-blocker.  She has remained in normal sinus rhythm.  Blood pressure has remained stable, and she was able to be restarted on her Accupril at a lower dose. Currently, blood pressure tolerating beta-blocker and ACE inhibitor well.  She has remained in normal sinus rhythm.  We will continue to monitor.  The patient had a chest x-ray obtained postop day 1 which was stable with mild atelectasis.  The patient had minimum drainage from chest tubes and chest tubes were discontinued in routine fashion. Followup chest x-rays remained stable.  The patient was encouraged to use her incentive spirometer.  She had been able to be weaned off oxygen with O2 saturations maintaining greater than 90% on room air.  The patient did have some mild acute blood loss anemia postoperatively.  Her hemoglobin and hematocrit were followed closely.  She was started on p.o. iron.  The patient did not require any blood transfusions postoperatively.  Her most recent hemoglobin and hematocrit was 7.9 and 24.7.  We will continue to monitor.  The patient also had some mild volume overload postoperatively.  She has been started on diuretics. Daily weights and follow closely, and the patient remained 6.5 kg above her preoperative weight.  Plan to continue diuretics at this time.  The patient is a known diabetic and blood sugars were followed closely postoperatively.  She was initially started on Lantus insulin and continued and added Humalog meal coverage and restarted on metformin p.o.  Currently, blood sugars are slightly elevated but we will continue to monitor.  We will adjust medicine as needed.  The patient was transferred out of SICU to the PCTU postop day 2.  She has been up ambulating well with cardiac rehab.  She is tolerating diet well.  No nausea or vomiting noted.  All incisions are clean, dry, and intact and healing well.  Most recent lab work shows white blood cell count 10.2,  hemoglobin 7.9, hematocrit 24.7, platelet count 250,000. Sodium of 136, potassium 4.0, chloride of 102, bicarbonate 25, BUN of 18, creatinine 1.02, glucose 198.  The patient is tentatively ready for discharge to home in the next 48 hours pending she remains stable.  FOLLOWUP APPOINTMENTS:  A followup appointment has been arranged with Dr. Donata Clay for January 22, 2011, at 10:30 a.m.  The patient will need to obtain PA and lateral chest x-ray 30 minutes prior to this appointment.  The patient needs to follow up with Dr. Eden Emms in 2 weeks. She will need to contact this office to make these arrangements.  ACTIVITY:  The patient instructed no driving until released to do so, no heavy lifting over 10 pounds.  She is told to ambulate several times per day, progress as tolerated, and continue her breathing exercises.  INCISIONAL CARE:  The patient is told to shower, washing her incisions using soap and water.  She is to contact the office if she develops any drainage or opening from any of her incision sites.  DIET:  The  patient educated on diet to be low-fat, low-salt, as well as diabetic diet.  DISCHARGE MEDICATIONS: 1. Nu-Iron 150 mg daily. 2. Lopressor 25 mg b.i.d. 3. Ultram 50 mg 1-2 tablets q.4 h. p.r.n. 4. Accupril 10 mg daily. 5. Lasix 40 mg daily x7 days. 6. Albuterol inhaler 2 puffs 4 hours p.r.n. 7. Enteric-coated aspirin 325 mg daily. 8. Lipitor 80 mg at night. 9. Humalog 20 units subcu t.i.d. with meals p.r.n. 10.Lantus insulin 70 units subcu daily at night. 11.Metformin 1000 mg b.i.d. 12.Nitroglycerin SL 0.4 mg p.r.n. 13.Potassium chloride 20 mEq daily x7 days. 14.Xanax 0.5 mg daily p.r.n. 15.Zantac 150 mg daily. 16.Zoloft 100 mg daily.     Sol Blazing, PA   ______________________________ Kerin Perna, M.D.    KMD/MEDQ  D:  01/03/2011  T:  01/04/2011  Job:  [id number]  cc:   Noralyn Pick. Eden Emms, MD, [facility] Gordy Savers, MD  Electronically  Signed by Cameron Proud PA on 01/08/2011 12:29:50 PM Electronically Signed by Kerin Perna M.D. on 01/09/2011 12:51:34 PM

## 2011-01-09 NOTE — Miscellaneous (Signed)
Summary: flu vaccine   Clinical Lists Changes  Observations: Added new observation of FLU VAX: Historical (10/21/2010 12:18)      Immunization History:  Influenza Immunization History:    Influenza:  Historical (10/21/2010) given at [facility] drug -

## 2011-01-09 NOTE — Miscellaneous (Signed)
Summary: Orders Update  Clinical Lists Changes  Orders: Added new Test order of Arterial Duplex Lower Extremity (Arterial Duplex Low) - Signed 

## 2011-01-10 ENCOUNTER — Ambulatory Visit (INDEPENDENT_AMBULATORY_CARE_PROVIDER_SITE_OTHER): Payer: BC Managed Care – PPO | Admitting: Cardiothoracic Surgery

## 2011-01-10 ENCOUNTER — Other Ambulatory Visit: Payer: Self-pay | Admitting: Cardiothoracic Surgery

## 2011-01-10 ENCOUNTER — Ambulatory Visit
Admission: RE | Admit: 2011-01-10 | Discharge: 2011-01-10 | Disposition: A | Discharge status: Home / Self Care | Payer: BC Managed Care – PPO | Source: Ambulatory Visit | Attending: Cardiothoracic Surgery | Admitting: Cardiothoracic Surgery

## 2011-01-10 DIAGNOSIS — I2510 Atherosclerotic heart disease of native coronary artery without angina pectoris: Secondary | ICD-10-CM

## 2011-01-15 ENCOUNTER — Encounter (INDEPENDENT_AMBULATORY_CARE_PROVIDER_SITE_OTHER): Payer: BC Managed Care – PPO | Admitting: Cardiothoracic Surgery

## 2011-01-15 ENCOUNTER — Encounter: Payer: Self-pay | Admitting: Cardiovascular Disease

## 2011-01-15 DIAGNOSIS — I2510 Atherosclerotic heart disease of native coronary artery without angina pectoris: Secondary | ICD-10-CM

## 2011-01-17 NOTE — Assessment & Plan Note (Signed)
OFFICE VISIT  VIKI, CARRERA DOB:  Apr 24, 1951                                        January 10, 2011 CHART #:  [id number]  CURRENT PROBLEMS: 1. Status post coronary artery bypass graft x3 on December 31, 2010,     for severe multivessel coronary disease with unstable angina. 2. Poorly controlled diabetes mellitus. 3. Obesity.  PRESENT ILLNESS:  The patient is a 60 year old diabetic who had multiple bypass surgery 10 days ago.  She was discharged home approximately 5 days ago.  She did develop some redness and slight drainage from her sternal incision.  She denies fever.  She has a slightly productive cough.  She denies angina.  She denies symptoms of CHF.  CURRENT MEDICATIONS: 1. Iron. 2. Lopressor 25 b.i.d. 3. Ultram for pain. 4. Accupril 10 mg a day. 5. Lasix 40 mg daily. 6. Albuterol inhaler. 7. Lipitor 80 mg a day. 8. Humalog and Lantus insulin. 9. Metformin. 10.Xanax. 11.Zoloft.  PHYSICAL EXAMINATION:  She is afebrile at 97.3, blood pressure 100/60, pulse 68 and regular, saturation 100% on room air.  Breath sounds are clear bilaterally.  The sternum has some erythema.  There is some eschar at the upper sternal incision.  There is slight skin separation between her pendulous breasts.  There is some macerated skin at the lower incision underneath her pendulous breasts.  The chest tube sites are healed and the sutures are removed.  Cardiac rhythm is regular.  There is no murmur or rub.  There is no significant ankle edema.  IMAGING:  PA and lateral chest x-ray shows some elevation of left hemidiaphragm.  Otherwise, the sternal wires are intact, and there has no evidence of effusion or edema.  ASSESSMENT AND PLAN:  The patient has mild cellulitis of the incision with some drainage.  We will start oral Keflex 500 mg p.o. t.i.d.  She is complaining of some reflux and has been taking her Reglan.  I will add Prevacid 30 mg a day.  She has a  congested cough and she will start Mucinex 600 mg p.o. b.i.d. over-the-counter.  I plan on seeing her back for a wound check in 5 days on January 15, 2011.  Kerin Perna, M.D. Electronically Signed  PV/MEDQ  D:  01/10/2011  T:  01/11/2011  Job:  [id number]

## 2011-01-17 NOTE — Assessment & Plan Note (Signed)
OFFICE VISIT  Shelby Lyons, Shelby Lyons DOB:  11/25/1951                                        January 15, 2011 CHART #:  [id number]  CURRENT PROBLEMS: 1. Status post coronary artery bypass graft x3 for unstable angina and     three-vessel coronary disease on December 31, 2010. 2. Poorly-controlled diabetes mellitus. 3. Obesity. 4. Cellulitis and superficial infection of the sternal incision with a     swab culture of drainage growing out Enterobacter.  PRESENT ILLNESS:  The patient returns for a general followup.  She was seen 5 days ago, at which time, she was found to have cellulitis and some eschar formation of the sternal incision with minimal drainage. The drainage was cultured and grew out Enterobacter sensitive to Cipro. She is currently taking Cipro as well as Keflex.  Her blood sugars have ranged between 200 and 300.  She denies fever and the drainage has stopped.  She still is weak, but is able to walk daily.  Her last chest x-ray showed no significant pleural effusion and she has no evidence of edema or CHF on exam.  She has been having some stomach issues and I told her she could stop taking her iron and she could double up on the Prevacid if needed.  She denies fever.  PHYSICAL EXAMINATION:  Vital Signs:  Blood pressure 115/70, pulse 70, respirations 18, saturation 94%, and temperature 97.1.  General:  She appears to be somewhat depressed and washed out, but no distress. Lungs:  Breath sounds are clear with diminished breath sounds at the left base from a mild paresis of the left hemidiaphragm.  Cardiac: Rhythm is regular and there is no murmur or rub.  Extremities:  She has no pedal edema.  Chest:  The sternum has some eschar and some mild cellulitis which appears improved from the last exam 5 days ago.  She has no problems with the right leg incision at the saphenous vein harvest site.  PLAN:  The patient will continue taking oral antibiotics.   I will see her back for a wound check in 1 week.  Kerin Perna, M.D. Electronically Signed  PV/MEDQ  D:  01/15/2011  T:  01/16/2011  Job:  [id number]  cc:   Noralyn Pick. Eden Emms, MD, [facility] Gordy Savers, MD

## 2011-01-22 ENCOUNTER — Ambulatory Visit (INDEPENDENT_AMBULATORY_CARE_PROVIDER_SITE_OTHER): Payer: Self-pay | Admitting: Cardiothoracic Surgery

## 2011-01-22 ENCOUNTER — Encounter: Payer: Self-pay | Admitting: Cardiothoracic Surgery

## 2011-01-22 DIAGNOSIS — I2510 Atherosclerotic heart disease of native coronary artery without angina pectoris: Secondary | ICD-10-CM

## 2011-01-23 ENCOUNTER — Encounter: Payer: Self-pay | Admitting: Internal Medicine

## 2011-01-23 NOTE — Assessment & Plan Note (Signed)
OFFICE VISIT  OSWIN, JOHAL DOB:  [birth year]                                        January 23, 2011 CHART #:  [id number]  CURRENT PROBLEMS: 1. Status post coronary artery bypass grafting x3 for unstable angina,     December 31, 2010. 2. Poorly controlled diabetes mellitus. 3. Obesity. 4. Cellulitis and superficial infection of the sternal wound, treated     with oral Cipro (swab culture positive for Enterobacter). 5. Diffuse coronary artery disease with poor targets. 6. Depression.  HISTORY OF PRESENT ILLNESS:  The patient returns for a wound check and general followup.  She is still having some problems with weakness, shortness of breath with exercise, depression, panic attacks, poor appetite, and insomnia.  She denies fever.  She has almost finished her 10-day course of oral Cipro.  She denies fever or drainage from the incision.  She is having some chest wall pain as well as some GERD symptoms.  PHYSICAL EXAMINATION:  VITAL SIGNS:  Blood pressure 95/65, pulse 78 and regular, saturation 99% on room air.  GENERAL:  She appears depressed and somewhat pale.  CHEST:  Her sternum appears to be somewhat improved. It is dry.  There is still some erythema and some eschar formation on the incision.  There is no sternal instability.  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  She has no ankle edema, and her leg incisions are healing well.  IMPRESSION AND PLAN:  The patient has had some chest pain, which appears to be incisional and she is not taking any of her tramadol, which I have encouraged her to use.  She has dyspnea on exertion, which I believe is due to her obesity, deconditioning situation.  Because of her surgical incision, I do not think she is ready to benefit from phase II cardiac rehab yet.  She appears to be somewhat pale and we will check a CBC at the lab today.  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  She has no evidence of congestive heart failure and her blood pressure is slightly low today, so I will reduce her Lopressor to 25 mg p.o. once a day.  We will switch her Zoloft to Lexapro 10 mg a day, and she will take another course of oral Cipro for her sternal cellulitis, culture positive for Enterobacter.  She will return in approximately 10 days to the clinic for followup.  At that time hopefully, she will be ready to be referred to outpatient cardiac rehab.  Kerin Perna, M.D. Electronically Signed  PV/MEDQ  D:  01/23/2011  T:  01/23/2011  Job:  [id number]  cc:   Gordy Savers, MD Noralyn Pick. Eden Emms, MD, [facility]

## 2011-01-25 ENCOUNTER — Other Ambulatory Visit: Payer: Self-pay | Admitting: Internal Medicine

## 2011-01-29 NOTE — Progress Notes (Signed)
Summary: [facility]: Office Visit  [facility]: Office Visit   Imported By: Earl Many 01/21/2011 17:10:31  _____________________________________________________________________  External Attachment:    Type:   Image     Comment:   External Document

## 2011-01-31 ENCOUNTER — Other Ambulatory Visit: Payer: Self-pay | Admitting: Cardiothoracic Surgery

## 2011-01-31 DIAGNOSIS — I2510 Atherosclerotic heart disease of native coronary artery without angina pectoris: Secondary | ICD-10-CM

## 2011-02-03 ENCOUNTER — Encounter (INDEPENDENT_AMBULATORY_CARE_PROVIDER_SITE_OTHER): Payer: Self-pay

## 2011-02-03 ENCOUNTER — Ambulatory Visit
Admission: RE | Admit: 2011-02-03 | Discharge: 2011-02-03 | Disposition: A | Discharge status: Home / Self Care | Payer: BC Managed Care – PPO | Source: Ambulatory Visit | Attending: Cardiothoracic Surgery | Admitting: Cardiothoracic Surgery

## 2011-02-03 DIAGNOSIS — I2510 Atherosclerotic heart disease of native coronary artery without angina pectoris: Secondary | ICD-10-CM

## 2011-02-04 NOTE — Assessment & Plan Note (Signed)
OFFICE VISIT  Shelby, Lyons DOB:  December 17, 1950                                        February 03, 2011 CHART #:  [id number]  Shelby Lyons comes in today for 2-week followup.  She is status post coronary artery bypass grafting x3 on December 31, 2010.  She was seen by Dr. Donata Clay in the office on January 22, 2011, with some drainage and superficial separation of her sternal incision.  The area was cultured and was positive for Enterobacter.  This was sensitive to Cipro and she has continued an additional 2 weeks on the Cipro.  She comes in today for recheck.  She feels that the sternal wound situation is actually improving somewhat.  She has almost finished with the Cipro and at this point, is having no drainage or erythema of the sternum.  She denies any chest discomfort.  She remained somewhat short of breath with exertion and generally weak.  Her appetite has been very poor and she is not doing much in the way of ambulation.  Her blood sugars have been somewhat labile as well and her medications have been adjusted by her primary care physician.  Her husband states that she is able to tolerate more activity than she was on her last visit, but she generally is progressing very slowly.  PHYSICAL EXAMINATION:  VITAL SIGNS:  Blood pressure is 109/71, pulse is 97, O2 sat 99% on room air.  Her sternal wound shows no erythema or drainage today.  The uppermost portion of the incision is closed, although she continues to have a very small superficial separation at the distal end, which has good granulation tissue and no purulence. Sternum is stable to palpation. HEART:  Regular rate and rhythm without murmurs, rubs or gallops. LUNGS:  Clear, although breath sounds are diminished in the bases, worse on the left than the right. EXTREMITIES:  Her lower extremity EVH incision has healed well and she has minimal edema.  Chest x-ray today shows stable left  hemidiaphragm elevation, which has been persistent postoperatively and tiny left effusion which again is stable and improving.  ASSESSMENT AND PLAN:  Overall, Shelby Lyons is progressing albeit slowly. I have asked her to continue the Lasix and to finish her course of the Cipro.  Since her sternum is stable and her wounds all appear to be in the process of healing, I think she is okay to begin with outpatient cardiac rehab.  She may increase her activity as well as and is encouraged to ambulate as tolerated.  Also, we have discussed her poor appetite and I have encouraged her to use supplement such as Boost or Ensure if she is not eating adequately in order to help with her wound healing.  I have also recommended a multivitamin if she is not already taking one.  We will have her follow up in 2 weeks with a repeat chest x- ray with Dr. Donata Clay to see how she is progressing and she may call in the interim if she experiences any problems or have questions.  Coral Ceo, P.A.  GC/MEDQ  D:  02/03/2011  T:  02/04/2011  Job:  [id number]  cc:   Noralyn Pick. Eden Emms, MD, [facility] Gordy Savers, MD

## 2011-02-18 NOTE — Progress Notes (Signed)
Summary: [facility]: Office Visit  [facility]: Office Visit   Imported By: Earl Many 02/14/2011 10:32:37  _____________________________________________________________________  External Attachment:    Type:   Image     Comment:   External Document

## 2011-02-25 ENCOUNTER — Other Ambulatory Visit: Payer: Self-pay | Admitting: Cardiothoracic Surgery

## 2011-02-25 DIAGNOSIS — I2510 Atherosclerotic heart disease of native coronary artery without angina pectoris: Secondary | ICD-10-CM

## 2011-02-26 ENCOUNTER — Ambulatory Visit
Admission: RE | Admit: 2011-02-26 | Discharge: 2011-02-26 | Disposition: A | Discharge status: Home / Self Care | Payer: BC Managed Care – PPO | Source: Ambulatory Visit | Attending: Cardiothoracic Surgery | Admitting: Cardiothoracic Surgery

## 2011-02-26 ENCOUNTER — Encounter (INDEPENDENT_AMBULATORY_CARE_PROVIDER_SITE_OTHER): Payer: Self-pay | Admitting: Cardiothoracic Surgery

## 2011-02-26 DIAGNOSIS — I2510 Atherosclerotic heart disease of native coronary artery without angina pectoris: Secondary | ICD-10-CM

## 2011-03-06 NOTE — Letter (Signed)
Summary: [facility] Office Visit Note   [facility] Office Visit Note   Imported By: Roderic Ovens 02/26/2011 10:48:59  _____________________________________________________________________  External Attachment:    Type:   Image     Comment:   External Document

## 2011-03-09 NOTE — Op Note (Signed)
NAME:  Shelby Lyons, Shelby Lyons NO.:  [id number]  MEDICAL RECORD NO.:  [id number]          PATIENT TYPE:  INP  LOCATION:  2308                         FACILITY:  [facility]  PHYSICIAN:  Guadalupe Maple, M.D.  DATE OF BIRTH:  [birth year]  DATE OF PROCEDURE:  12/31/2010 DATE OF DISCHARGE:                              OPERATIVE REPORT   PROCEDURE:  Intraoperative transesophageal echocardiography.  Ms. Shelby Lyons is a 60 year old white female with a history of unstable angina.  She has a longstanding history of coronary artery disease, having suffered an anterior wall MI in 1994, with prior PTCA of the LAD and an acute MI in 2005, and had a drug-eluting stent placed in the left circumflex, and stenting of the right coronary artery as well. She presented with unstable angina and is now scheduled to undergo coronary artery bypass grafting by Dr. Donata Clay.  Intraoperative transesophageal echocardiography was requested to evaluate the left and right ventricular function and to serve as a monitor for intraoperative volume status and to assess for any valvular pathology.  The patient was brought to the operating room at [facility] and general anesthesia was induced without difficulty.  The trachea was intubated without difficulty.  Following orogastric suctioning, the transesophageal echocardiography probe was then inserted into the esophagus without difficulty.  IMPRESSION:  Prebypass findings: 1. Aortic valve.  The aortic valve leaflets were mildly thickened but     opened normally and there was no aortic insufficiency. 2. Mitral valve.  There was moderate mitral annular calcification,     especially involving the base of the posterior leaflet.  The mitral     valve opened normally and there was trace mitral insufficiency.     There was no prolapse or fluttering of the leaflets. 3. Left ventricle.  There was mild-to-moderate left ventricular     dysfunction.  There  appeared to be  evidence of a previous myocardial     infarction involving the distal anterior wall and apex and anterior     septum.  This area was thin and akinetic.  The remaining     segments of the left ventricle appeared to contract normally and     ejection fraction was estimated at 45%.  There was no thrombus     noted in the left ventricular apex. 4. Right ventricle.  There was normal-appearing right ventricular     function with normal contractility of the right ventricular free     wall and normal right ventricular size. 5. Tricuspid valve.  There was trace tricuspid insufficiency and the     valve was structurally intact. 6. Interatrial septum.  There was an evident patent foramen ovale     noted on color Doppler.  It was seen in multiple views.  The degree     of shunting was trivial.  It appeared to be exclusively left-to-     right. 7. Left atrium.  There was no thrombus noted in the left atrium or     left atrial appendage.  Left atrial size appeared to be within     normal limits. 8. Ascending  aorta.  The ascending aorta appeared free of significant     atheromatous disease.  There was no dilatation of the ascending     aorta and there was a well-defined sinotubular ridge and aortic     root. 9. Descending aorta.  The descending aorta appeared to be within     normal limits of size and was free of atheromatous disease.  Postbypass findings: 1. Aortic valve.  The aortic valve was unchanged from the prebypass     findings.  There was mild thickening of the leaflets.  No aortic     insufficiency and normal opening of the valve. 2. Mitral valve.  There was trace mitral insufficiency which is     unchanged in the prebypass study. 3. Left ventricle.  Again, there appeared to be akinesis and thinning     involving the distal and anterior wall and apex and anterior     septum.  There was a small amount of air noted initially on the     postbypass views in the left  ventricular apex.  This improved with     time.  The left ventricular ejection fraction appeared unchanged     from the prebypass study and was approximately 45-50%. 4. Right ventricle.  There was normal-appearing right ventricular     function and normal contractility of the right ventricular free     wall. 5. Tricuspid valve.  The tricuspid valve was unchanged from the     prebypass study.  There was trace tricuspid insufficiency. 6. Interatrial septum.  There was a residual small patent foramen     ovale present in the postbypass views.          ______________________________ Guadalupe Maple, M.D.     DCJ/MEDQ  D:  12/31/2010  T:  01/01/2011  Job:  [id number]  Electronically Signed by Kipp Brood M.D. on 03/09/2011 03:11:15 PM

## 2011-03-13 NOTE — Assessment & Plan Note (Signed)
OFFICE VISIT  Shelby Lyons, Shelby Lyons DOB:  1951-10-04                                        February 26, 2011 CHART #:  [id number]  CURRENT PROBLEMS: 1. Superficial sternal infection - nonhealing, status post CABG x3,     December 31, 2010. 2. Severe diabetes mellitus. 3. Obesity. 4. 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 5. History of depression.  PRESENT ILLNESS:  Shelby Lyons is a 60 year old Caucasian female, diabetic, nonsmoker, returns for further followup after multivessel bypass grafting almost 2 months ago.  She is scheduled to start her outpatient cardiac rehab at the [facility].  They requested a stress test before initiating therapy, which will be arranged through the patient's cardiologist Dr. Eden Emms.  She still has some chest wall symptoms.  Her sternal incision is almost completely healed with only a small eschar at the lower aspect in between the breast line.  There is no surrounding cellulitis or instability.  She remains in a sinus rhythm without symptoms of angina or CHF.  The chest x-ray shows no pleural effusion, but does have a residual left hemidiaphragm elevation probably secondary to a phrenic nerve paresis at the time of surgery.  Hopefully, this was slowly improved over time.  MEDICATIONS:  She remains on her multiple medications including, Lopressor, Accupril, Lasix, aspirin, Lipitor, Lantus insulin, metformin, Xanax, Prevacid, Zoloft, and albuterol p.r.n.  PHYSICAL EXAMINATION:  VITAL SIGNS:  Blood pressure 108/70, pulse 80, respirations 16, saturation 95%.  GENERAL:  She is alert and pleasant. LUNGS:  Breath sounds are clear and equal.  The sternum is stable and well healed.  A small eschar remains and this is gently removed with clean granulation tissue underneath in the subcutaneous - dermis layer. This was treated with a Neosporin and Band-Aid dressing.  CARDIAC: Rhythm is regular without murmur or  gallop.  ABDOMEN:  Soft. EXTREMITIES:  Revealed no edema.  The leg incision is well-healed.  Her chest x-ray shows no evidence of pleural effusion or CHF.  The left hemidiaphragm remains elevated.  IMPRESSION:  Overall, she has made improvement.  She has lost 20 pounds. She is ready to start rehab.  Her appetite and depression are better.  I encouraged her to enter the rehab program with a positive attitude and that she should be patience with her progress.  PLAN:  I will see her back in 4 weeks for final exam of her incision.  Shelby Lyons, M.D. Electronically Signed  PV/MEDQ  D:  02/26/2011  T:  02/27/2011  Job:  [id number]  cc:   Noralyn Pick. Eden Emms, MD, [facility] Gordy Savers, MD

## 2011-03-15 IMAGING — CR DG CHEST 2V
2 series · 2 of 2 positions shown · non-contrast
Comparison: Chest x-ray of 01/10/2011

CLINICAL DATA: Coronary artery disease, heart surgery 5 weeks ago,
shortness of breath

CHEST - 2 VIEW

[w chest pa]
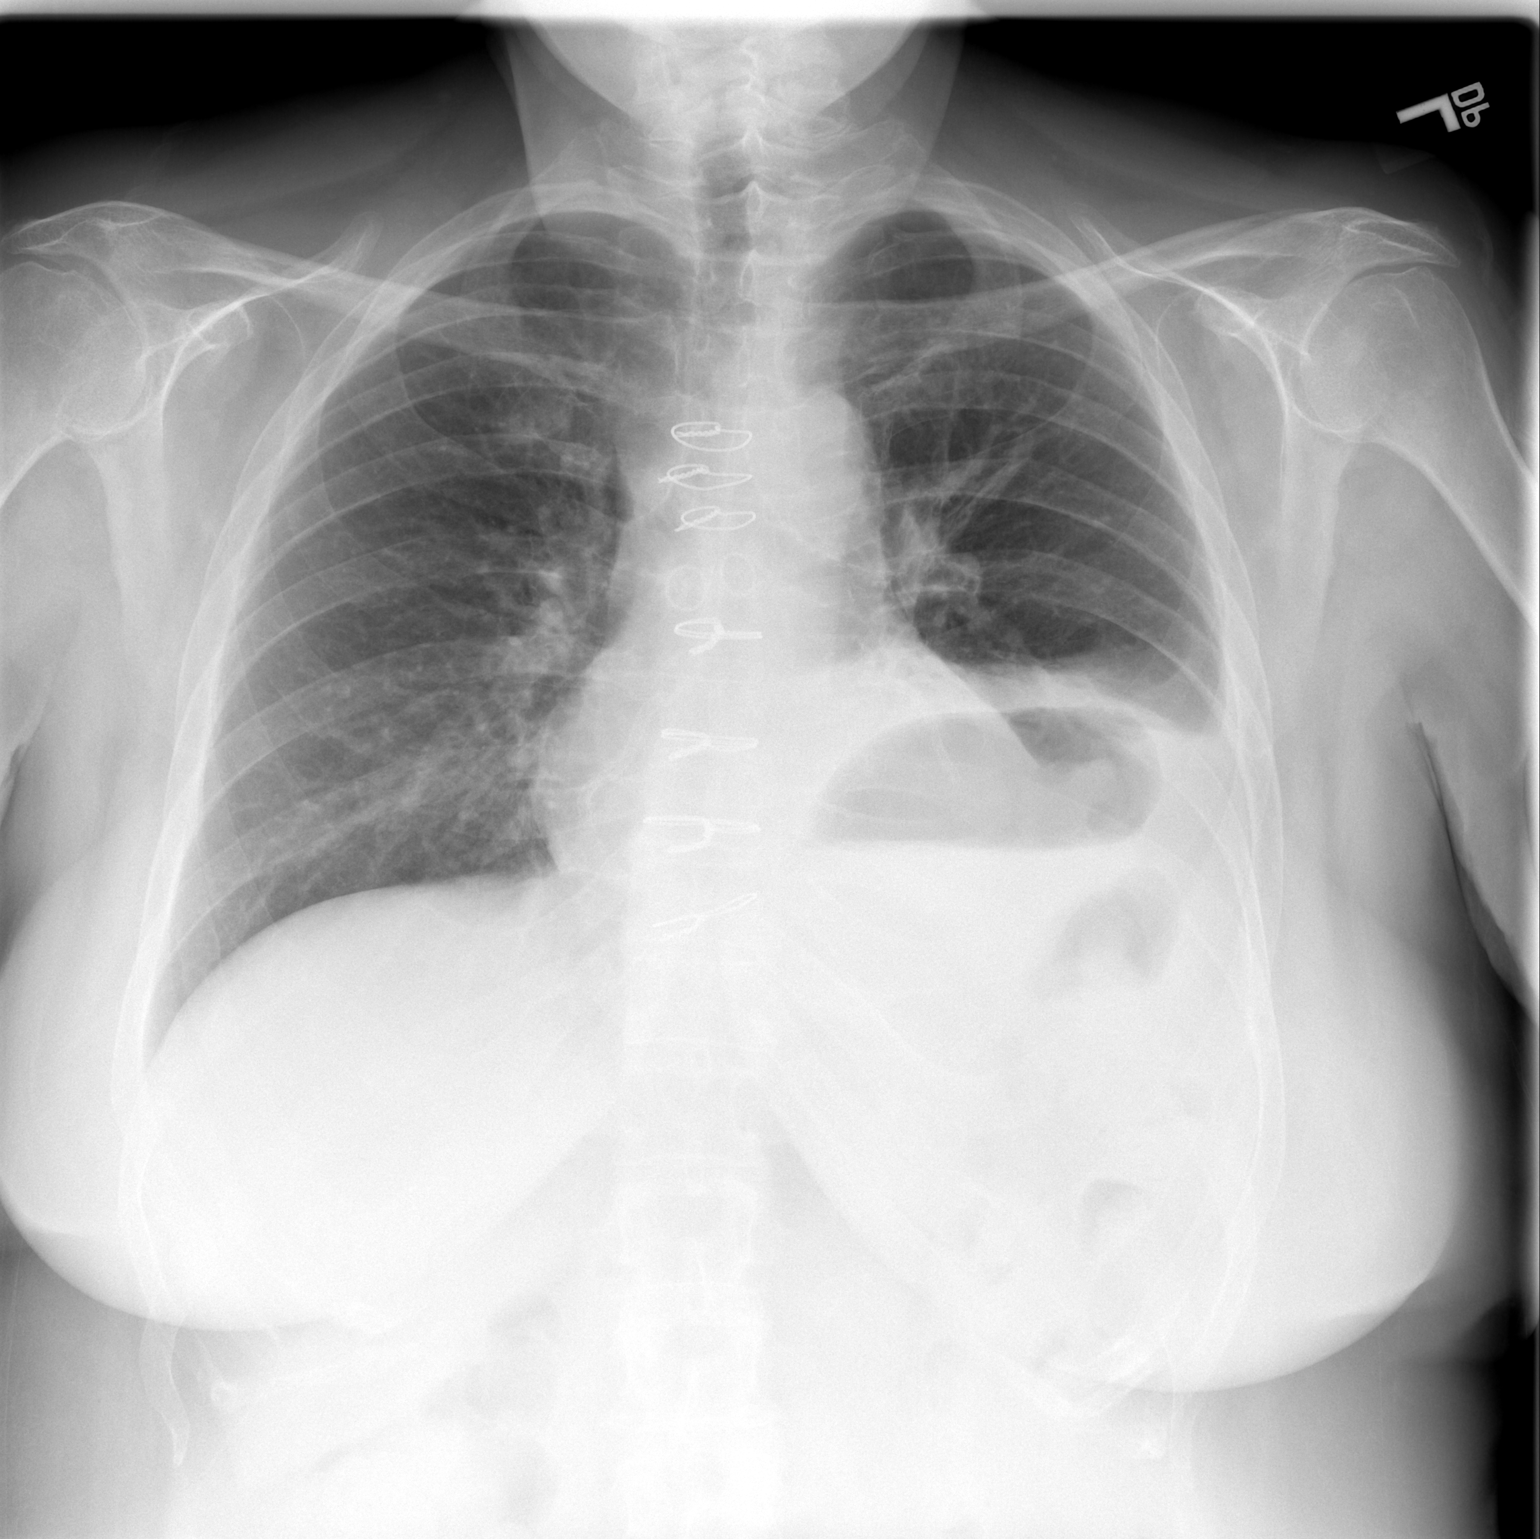

[w chest lat]
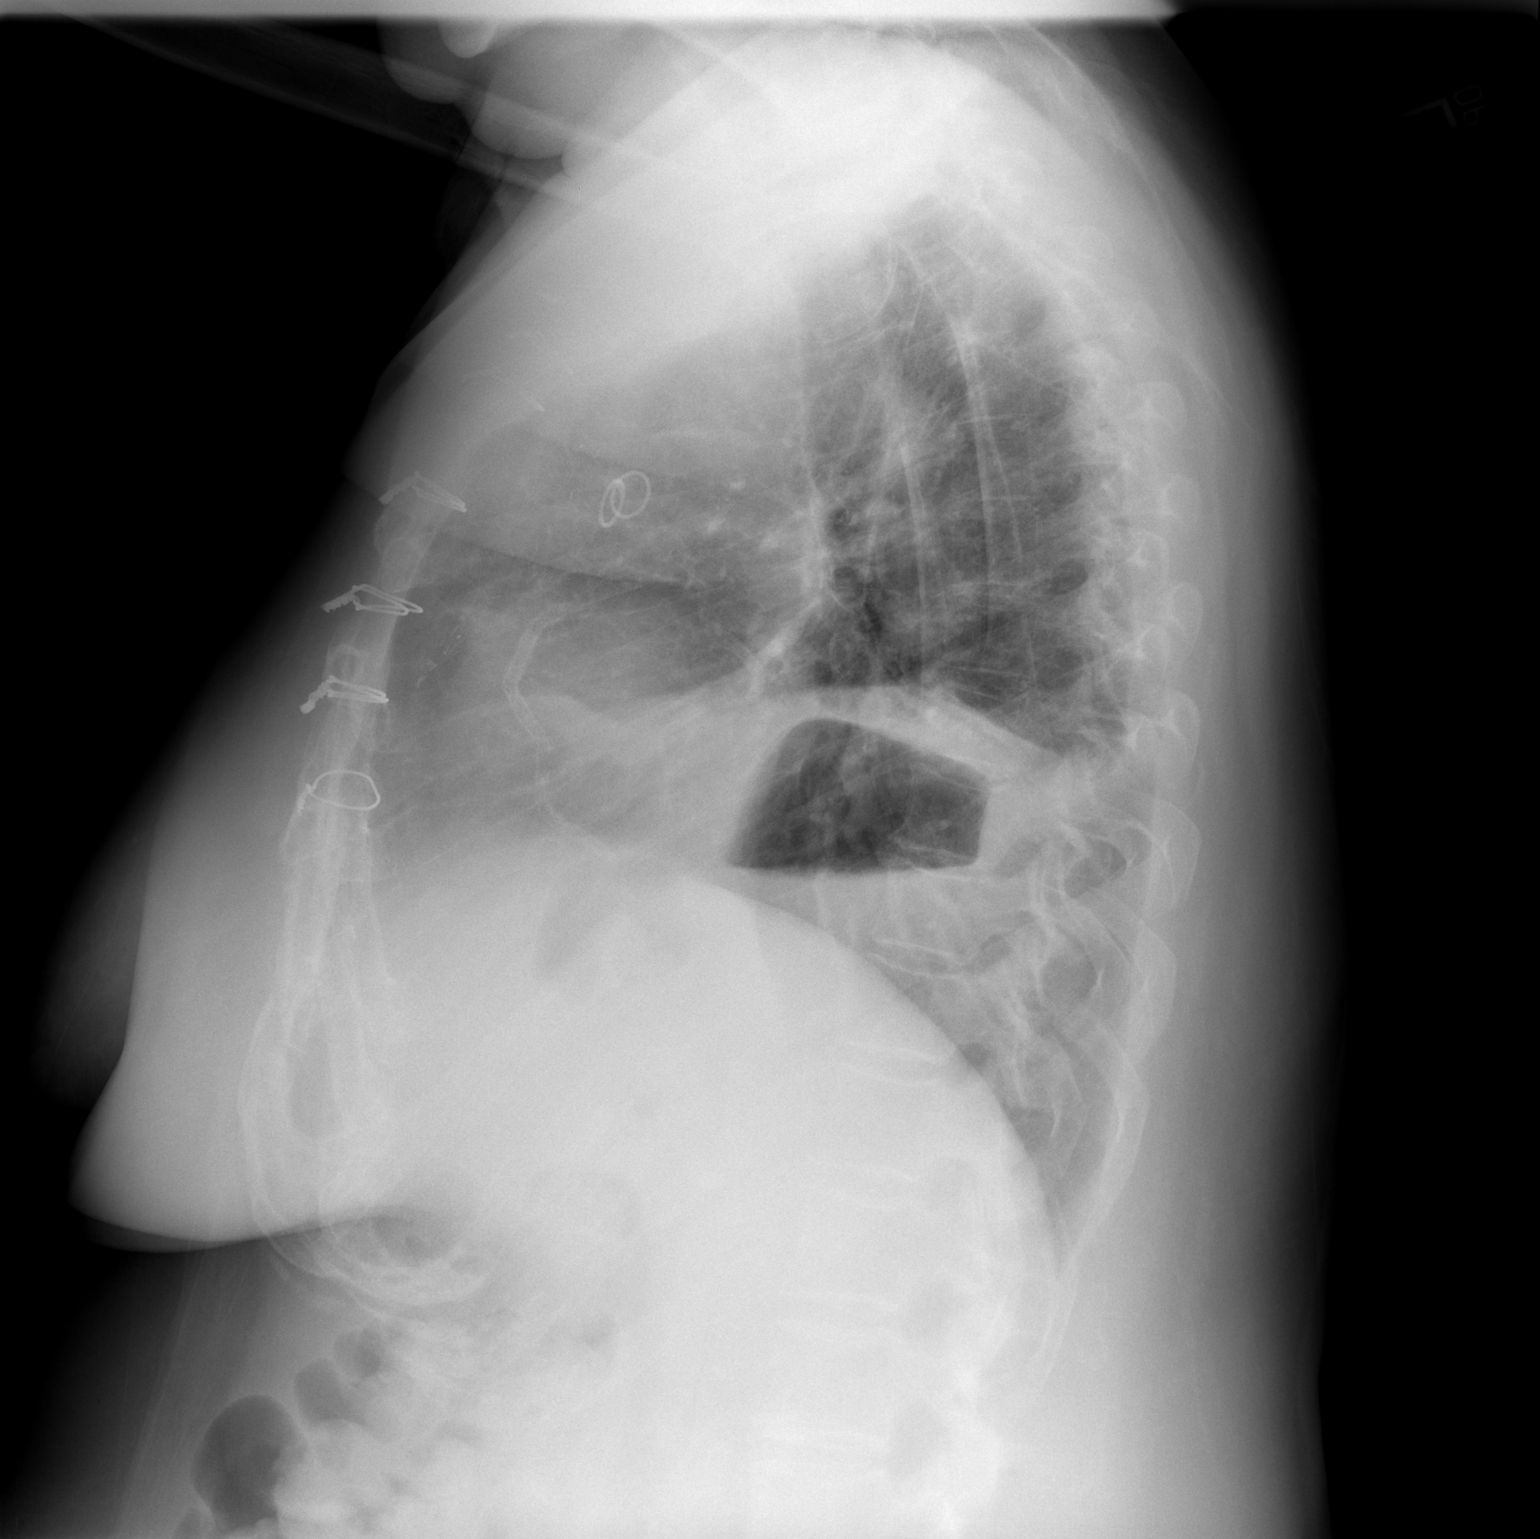

[2 of 2 positions shown; findings below may reference images not displayed]

FINDINGS: The left hemidiaphragm remains elevated.  Linear
atelectasis at the left lung base has improved and only a tiny left
effusion remains.  The right lung is clear.  Heart size is stable.
Median sternotomy sutures are again noted.
IMPRESSION: Improved aeration with decrease in left basilar atelectasis and
small left effusion.  Persistent elevation of the left
hemidiaphragm.

## 2011-03-26 ENCOUNTER — Ambulatory Visit (INDEPENDENT_AMBULATORY_CARE_PROVIDER_SITE_OTHER): Payer: Self-pay | Admitting: Cardiothoracic Surgery

## 2011-03-26 DIAGNOSIS — I2510 Atherosclerotic heart disease of native coronary artery without angina pectoris: Secondary | ICD-10-CM

## 2011-03-27 NOTE — Assessment & Plan Note (Signed)
OFFICE VISIT  Shelby Lyons, DEVIN DOB:  June 14, 1951                                        March 26, 2011 CHART #:  [id number]  HISTORY:  The patient is a 60 year old obese, diabetic, nonsmoker who underwent coronary artery bypass grafting x3 on December 31, 2010, for severe multivessel coronary artery disease with unstable angina.  We have been following her in the office postoperatively.  She had a superficial sternal infection that we are evaluating today as a final check to see completeness of healing.  Currently, she reports that she is doing well in cardiac rehab.  She did have one recent episode where she got very sweaty and did vomit, but this has not recurred.  She feels as though her progress has been good in regard to exercise tolerance. She does get shortness of breath at times.  But overall this is felt to be improving over time.  She has not been seen in Cardiology followup, and we will help her to make this arrangement.  PHYSICAL EXAMINATION:  Vital Signs:  Blood pressure 141/75, pulse 72, respirations 20, oxygen saturation is 98% on room air.  General Appearance:  Well-developed adult female in no acute distress. Pulmonary:  Reveals diminished breath sounds in the left side.  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 Cardiac:  Regular rate and rhythm.  Normal S1 and S2.  Abdomen:  Soft and benign.  Extremities:  No edema.  Incisions are well healed without evidence of infection.  There is no sternal instability or clicking.  ASSESSMENT:  The patient continues to make good recovery following her surgical revascularization.  We will discharge her to follow up to a p.r.n. basis at this point.  We have called the [place] office to help make arrangements for her to be seen in Cardiology followup.  We will of course see her at any time for any new surgical needs or as requested.  Rowe Clack,  P.A.-C.  Sherryll Burger  D:  03/26/2011  T:  03/27/2011  Job:  [id number]  cc:   Noralyn Pick. Eden Emms, MD, [facility]

## 2011-04-03 ENCOUNTER — Telehealth: Payer: Self-pay | Admitting: Cardiovascular Disease

## 2011-04-03 NOTE — Telephone Encounter (Signed)
Spoke with Angelica Chessman at [facility] and let her know pt has not seen Dr. Eden Emms since April 2009. Pt had CABG in January 2012 and has followed up with Dr. Donata Clay.  Angelica Chessman will call Dr. Zenaida Niece Trigt's office regarding pre med.

## 2011-04-03 NOTE — Telephone Encounter (Signed)
Does pt need pre-med before dental work - appt on 5/8. Fax # [phone].

## 2011-04-14 ENCOUNTER — Encounter: Payer: Self-pay | Admitting: Cardiovascular Disease

## 2011-04-17 ENCOUNTER — Other Ambulatory Visit: Payer: Self-pay | Admitting: Internal Medicine

## 2011-04-22 NOTE — Assessment & Plan Note (Signed)
Shelby Lyons                            CARDIOLOGY OFFICE NOTE   DIAMANTINA, EDINGER                      MRN:          [id number]  DATE:04/03/2008                            DOB:          [birth year]    A 57-year patient with history of anterior wall MI, diabetes,  significant weight gain over the last few years, hypercholesterolemia.  The patient is doing well.  She gets occasional dyspnea but is  functional class I.  Her EF has been in the 40-45% range.  She had a  stress Myoview today.  It was done with adenosine.  Her EF was 47% with  a fixed anterior apical wall infarct, unchanged from previous Myoview.   REVIEW OF SYSTEMS:  Her review of systems is remarkable for no  significant edema.  Sugars have been in reasonable control.  The patient  has been somewhat sedentary.  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  We talked  about her diet and increased physical activity levels.  She has been  compliant with her meds.  She needs refills called into her pharmacy.   ALLERGIES:  SULFA.   MEDICATIONS:  1. Aspirin a day.  2. Lantus as directed.  3. Zoloft 100 a day.  4. NovoLog sliding scale.  5. K-Dur 20 a day.  6. Glucophage 1 gram b.i.d.  7. Metoclopramide 10 b.i.d.  8. Lipitor 80 day.  9. Accupril 20 a day.  10.Nexium 40 a day.  11.Lasix 40 b.i.d.  12.Lopressor 12.5 b.i.d.   PHYSICAL EXAMINATION:  GENERAL:  An overweight diabetic female in no  distress.  Affect is appropriate.  She had her son and daughter-in-law,  I believe Shelby Lyons and Shelby Lyons, with her today.  VITAL SIGNS:  Weight was 230.  Blood pressure is 130/69, pulse 60 and  regular, respiratory rate 16.  HEENT:  Unremarkable.  NECK:  Carotids are without bruit, no lymphadenopathy, thyromegaly, or  JVP elevation.  LUNGS:  Clear with diaphragmatic motion.  No wheezing.  HEART:  S1 and S2 with normal heart sounds.  PMI  distant, not palpable.  ABDOMEN:  Protuberant.  Bowel sounds positive.  No AAA, no tenderness,  no hepatosplenomegaly, no hepatojugular reflexes.  EXTREMITIES:  Distal pulses intact, no edema.  NEUROLOGIC:  Nonfocal.  SKIN:  Warm and dry.  No muscle weakness.   IMPRESSION:  1. Coronary disease and multiple previous stents.  Last      catheterization in May of 2006.  She has a stent to the proximal      left anterior descending when she had an anterior wall myocardial      infarction in 1994, subsequently had a drug eluding stent to the      mid circumflex and distal right.  The patient will continue her      aspirin therapy.  I will have to look back through my records to      see why she is not on chronic Plavix, but she is doing well and has  not had any interventions since 2006.  2. Hypertension, currently well controlled.  Continue current dose of      Lasix, potassium, and  Accupril.  Continue low-salt diet.  3. Diabetes.  Continue to use Lantus sliding scale and oral      hypoglycemics.  Follow up with primary care physician.  Hemoglobin      A1c quarterly.  4. History of reflux.  Continue Nexium 40 a day.  Avoid late-night      meals.  Encourage physical activity and weight loss.  5. Lower extremity edema, improved.  Continue low-salt diet.  Elevate      legs at the end of the day.  Continue furosemide.  Consider BMET      and BMP on next visit.   Overall, Shelby Lyons is doing well.  Her Myoview is low-risk.  I will see  her in 6 months.     Noralyn Pick. Eden Emms, MD, [facility]  Electronically Signed    PCN/MedQ  DD: 04/03/2008  DT: 04/03/2008  Job #: [id number]

## 2011-04-25 NOTE — Cardiovascular Report (Signed)
NAME:  Shelby Lyons, Shelby Lyons NO.:  [id number]   MEDICAL RECORD NO.:  [id number]          PATIENT TYPE:  OIB   LOCATION:  6523                         FACILITY:  [facility]   PHYSICIAN:  Charlies Constable, M.D. [facility] DATE OF BIRTH:  03-31-1951   DATE OF PROCEDURE:  04/25/2005  DATE OF DISCHARGE:  04/26/2005                              CARDIAC CATHETERIZATION   INDICATIONS:  Shelby Lyons is 60 years old and has multiple percutaneous  coronary interventions. She had an anterior wall infarction treated in 1994  with a stent to the proximal LAD. Last fall, she had a lateral infarction  treated with a drug-eluting stent in the mid-circumflex artery and was left  with a distal dissection. She subsequently had elective stenting of the  distal right coronary artery and tandem overlying Taxus stents in the mid  and proximal right coronary artery. She then had to come back last fall for  intervention on the circumflex artery at the site of dissection and she had  two bare-metal stents placed distal to and overlapping the drug-eluting  stent in the distal circumflex artery. She recently had an abnormal  Cardiolite scan which was positive for inferior ischemia and we brought her  in for evaluation and angiography.   DESCRIPTION OF PROCEDURE:  The procedure was performed via the right femoral  artery using arterial sheath and 6-French preformed coronary catheters. A  frontal wall arterial puncture was performed with Omnipaque and contrast was  used. After completion of the diagnostic study, we made a decision to  proceed with intervention on the right coronary artery both in the mid and  proximal section and the distal section for persistent restenosis and end-  stent restenosis.   The patient was given Angiomax bolus and infusion. She had been on the  Triton study drug and we continued this. We used a 6-French JR4 guiding  catheter side holes and a [place] wire. We passed the wire down across  the  two lesions without difficulty. We predilated the end-stent restenosis in  the proximal and midportion of the right coronary artery with a 2.5 mg by 20  mm Maverick performing three inflations up to 8 atmospheres for 30 seconds.  We then directed stented the lesion in the distal right coronary artery  which was located just proximal to the previously placed Cypher stent. We  used a 2.5 x 18 mm Cypher and deployed this with one inflation of 16  atmospheres for 30 seconds. This overlapped the previously placed Cypher  stent.   We then placed a 2.5 x 18 mm Cypher stent in the midportion of the vessel.  This ended just outside the previously placed stent. We then overlapped a  3.0 x 23 mm Cypher. We had difficulty advancing this into the stent in the  mid-vessel and had to use the buddy wire in order to accomplish this. We  deployed this with one inflation of 16 atmospheres by 30 seconds.   We postdilated the distal stent with a 3.25 x 50 mm Maverick performing one  inflation up to 18 atmospheres by 30 seconds. We  postdilated the two stents  in the mid and proximal right coronary artery with a 3.2 x 50 mm Quantum  Maverick and performed several inflations up to 22 atmospheres for 30  seconds. We were not able to complete eliminate the waste right at the bend  in the midvessel at 22 atmospheres. For this reason, we went in with a 3.25  [place] monorail and we performed one inflation up to 22 atmospheres for 30  seconds. This improved the waste but did not quite completely eliminate it.  The final diagnostic study was then performed through the guiding catheter.  The patient tolerated the procedure well and left the laboratory in  satisfactory condition.   RESULTS:  1.  The left main coronary artery:  The left main coronary artery was free      of significant disease.  2.  The left anterior descending artery:  The left anterior descending      artery gave rise to two diagonal branches and  two septal perforators.      There was less than 10% stenosis of the stent in the proximal LAD. There      was 60% narrowing proximal to the stent site in the proximal LAD.  3.  Circumflex artery: The circumflex artery gave rise to an atrial branch,      marginal branch, and a posterolateral branch. There was three      overlapping stents in the circumflex artery which crossed the marginal      branch and extended into the posterolateral branch. There was 70%      diffuse end-stent renarrowing in the two distal stents which were bare-      metal stents.  4.  Right coronary artery:  The right coronary artery is a moderate-sized      vessel that gave rise to a right ventricular branch, a posterior      descending and a posterolateral branch. There was 95% stenosis in the      distal right coronary artery just to the proximal edge of the Cypher      stent. There was diffuse 80% narrowing in the mid and distal portion of      the two overlapping stents in the mid, proximal, and right coronary      artery. These were Taxus stents.  5.  The left ventriculogram:  The left ventriculogram performed in the RAO      projection showed akinesis of the inferobasilar segment. The overall      wall motion was good with an estimated ejection fraction of 50%.      Following stenting of the lesion of the distal right coronary artery,      this improved from 95% to 10%. Following placement of tandem overlapping      Cypher stents in the mid and proximal portion of the right coronary      artery for end-stent restenosis. This improved from 80% to 10%.  6.  The aortic pressure was 115/65 with a mean of 83 and a left ventricular      pressure was 115/28.   CONCLUSION:  1.  Coronary artery disease with multiple percutaneous coronary      interventions as described above with less than 10% stenosis at the      stent site in the proximal left anterior descending artery with 60%     narrowing in the proximal left  anterior descending artery proximal to      the stent site, 70%  end-stent restenosis in the distal two stents (bare-      metal stents) of three overlapping stents in the mid and distal      segments, 80% end-stent restenosis in the overlapping Taxus stents in      the mid and proximal right coronary artery and 95% proximal edge      restenosis of the Cypher stent in the distal right coronary artery and      inferobasal wall akinesis.  2.  Successful stenting of the edge stent restenosis in the distal right      coronary artery using a Cypher drug-eluting stent with improvement of      narrowing from 95% to 10%.  3.  Successful placement of tandem overlying Cypher stent in the mid and      proximal right coronary artery for end-stent restenosis with improvement      in narrowing from 80% to 10%.   DISPOSITION:  The patient is to return recovery for further observation. The  lesions in the circumflex stent are borderline significant and are fairly  distal. The procedure we did today was quite long and I elected not to treat  the circumflex artery. If she has recurrent symptoms or abnormal function  testing, it is possible this may need to be done later.      BB/MEDQ  D:  04/25/2005  T:  04/26/2005  Job:  [id number]   cc:   Charlton Haws, M.D.   Gordy Savers, M.D. [facility]   Cardiopulmonary Lab

## 2011-04-25 NOTE — Discharge Summary (Signed)
NAME:  Shelby Lyons, RENDER NO.:  [id number]   MEDICAL RECORD NO.:  [id number]          PATIENT TYPE:  INP   LOCATION:  4730                         FACILITY:  [facility]   PHYSICIAN:  Charlton Haws, M.D.     DATE OF BIRTH:  1951-09-21   DATE OF ADMISSION:  10/19/2004  DATE OF DISCHARGE:  10/23/2004                                 DISCHARGE SUMMARY   BRIEF HISTORY:  This is a pleasant 60 year old female who was admitted to  [facility] on October 19, 2004 by Dr. Dorethea Clan for further treatment  and evaluation of congestive heart failure.  The patient has known coronary  artery disease .  She is status post anterior wall myocardial infarction in  1994 with percutaneous intervention at that time on the LAD.  In October  2005 she had an acute anterior lateral myocardial infarction and underwent  percutaneous intervention of the circumflex and the RCA with drug eluting as  well as non drug-eluting stents.  She had normal ejection fraction at that  time.   The patient presented on October 19, 2004 with pulmonary edema consistent  with flash pulmonary edema.  She initially presented to [facility]  with shortness of breath.  She apparently was sitting in her kitchen on  evening of admission when around 11:30 she began to feel short of breath.  She decided to go to bed.  However, she was unable to sleep.  She called her  son who is a paramedic.  He found the patient to be extremely short of  breath.  She was taken to [facility] where she was  treated with Lasix and transferred to [facility] where she was  admitted by Dr. Dorethea Clan.   PAST MEDICAL HISTORY:  Significant for coronary artery disease as noted  above.  She has diabetes mellitus, hypertension, and she was hypertensive  upon presentation to the emergency room.  She has hyperlipidemia, obesity,  history of anxiety as well as panic disorder and gastroesophageal reflux  disease.   ALLERGIES:  She is allergic to:  1.  SULFA.  2.  PENICILLIN.   SOCIAL HISTORY:  The patient lives in [place].  She does not smoke although  she has a 10-year pack history of smoking.  She quit about 30 years ago.  She does not use alcohol.   FAMILY HISTORY:  The patient's father had coronary disease.  He died at age  24.  She has a sister with hypertension.   HOSPITAL COURSE:  As noted, this patient was admitted to [facility]  on transfer from [facility] with flash pulmonary edema.  She was  found to have mildly elevated troponins.  However, it was not clear whether  this is from congestive heart failure or cardiac ischemia.  She was  hypertensive at time of presentation.  However, later she was mildly  hypotensive.  The patient was treated with heparin.  A decision was made to  proceed with cardiac catheterization.   The patient underwent cardiac catheterization on October 21, 2004 performed  by Dr. Eden Emms.  She was found to have an ejection fraction of 45-50% with  anterior apical hypokinesis.  There was no significant change in her  coronary anatomy.  Continued medical therapy was recommended.  Please see  Dr. Fabio Bering dictated note for full details.   A 2-D echo was performed later that day.  However, it was a poor quality  study and the ejection fraction could not be determined although she did  have severe hypokinesis of the septum.   The patient diuresed during her stay.  BNP was repeated.  On the day of  discharge it was 101, it was 291 on admission.  A chest x-ray the day prior  to discharge showed a small right effusion, but no congestive heart failure.  She had atelectasis versus pneumonia.  The patient's medications were  adjusted and arrangements were made to proceed with discharge.  Dr. Eden Emms  recommended Xanax for the patient and the patient requested a prescription  for an albuterol metered dose inhaler as she occasionally has some wheezing  at  home.  She feels that part of her dyspnea may be due to anxiety.  She is  very concerned that she might experience further congestive heart failure.   LABORATORY DATA:  Please see Dr. Fabio Bering dictated cath report for full  details of catheterization.  Medical therapy has been recommended.  On the  day of discharge, CBC revealed hemoglobin 10.9, hematocrit 31.7, WBC 6.1,  platelets 310,000, BNP 101, as noted on admission it was 291.  See chest x-  ray as noted above.  Please see echo as noted above.  A basic metabolic  panel on the 15th revealed BUN 14, creatinine 0.9, potassium 3.7, glucose  119, troponin enzymes were mildly elevated at 0.14, 0.21 and 0.19.  CK-MB  enzymes were negative.  Stool for occult blood was negative.  TSH was 2.767.  A lipid profile revealed cholesterol 143, triglycerides 65, HDL 36, LDL 94.  A chest x-ray on admission showed an asymmetric opacity in the right lower  lung suspicious for infiltrate or pneumonia.  Followup recommended.  The  followup chest x-ray showed patchy area of atelectasis or pneumonia with  small right effusion, but no definite congestive heart failure.   DISCHARGE MEDICATIONS:  1.  Lopressor 12.5 mg t.i.d.  2.  Lasix 40 mg a.m., 20 mg p.m.  3.  Xanax 0.5 mg at bedtime #30, one refill.  4.  Lipitor 40 mg at bedtime.  5.  Nexium 40 mg daily.  6.  Zoloft 100 mg daily.  7.  Albuterol metered dose inhaler 1-2 puffs q.i.d. p.r.n. one unit with      three refills.  8.  TRITAN study drug.  9.  Accupril 40 mg daily.  10. Actos 15 mg daily.  11. Glucophage.  The patient was told to hold her Glucophage until Friday.      Her home dose was 500 mg daily.  12. Estratest one at bedtime.  13. Coated aspirin 325 mg daily.  14. Lantus insulin 80 units at bedtime.  15. Reglan 10 mg b.i.d.   The patient was told to avoid any strenuous activity or driving for two days.  She was to be on low salt, low fat diabetic diet.  She was given  further  instructions regarding her congestive heart failure.  She was to  have a basic metabolic panel and a BNP blood test at her next office visit.  She was to see one of the physician assistants November  28 at 10:30.  She  will see Dr. Eden Emms December 16 at 2:15.  She will see Dr. Amador Cunas as  needed.   PROBLEM LIST AT TIME OF DISCHARGE:  1.  Coronary artery disease with two previous myocardial infarctions and      three previous percutaneous interventions. Ejection fraction 45-50%.  2.  Cardiac catheterization this admission showing diffuse disease.  Medical      therapy recommended.  3.  Flash pulmonary edema this admission.  4.  Diabetes mellitus.  5.  Hypertension.  6.  Elevated lipids.  7.  Panic disorder.  8.  Gastroesophageal reflux disease.  9.  Obesity.  10. Anemia.  11. Echo performed this admission.  See results as noted above.  12. Mild hypotension, asymptomatic.       DR/MEDQ  D:  10/23/2004  T:  10/23/2004  Job:  [id number]   cc:   Gordy Savers, M.D. [facility]

## 2011-04-25 NOTE — Cardiovascular Report (Signed)
NAME:  Shelby Lyons, Shelby Lyons NO.:  [id number]   MEDICAL RECORD NO.:  [id number]          PATIENT TYPE:  INP   LOCATION:  4730                         FACILITY:  [facility]   PHYSICIAN:  Charlton Haws, M.D.     DATE OF BIRTH:  14-May-1951   DATE OF PROCEDURE:  DATE OF DISCHARGE:                              CARDIAC CATHETERIZATION   PROCEDURE:  Coronary arteriography.   INDICATIONS:  History of three vessel coronary disease to old anterior MI  with stenting of the LAD in 1994, more recent intervention to the right  coronary artery and circumflex for inferior wall MI.  Patient presents with  increasing shortness of breath, possible pulmonary edema.   Standard catheterization done from the right femoral artery.  Left main  coronary artery had 30% discreet stenosis.   Left anterior descending artery had a 60% tubular lesion prior to the mid  vessel stents.  The stents themselves were widely patent.  Distal vessel had  30-40% multi discreet lesions.   First diagonal branch was small and diffusely diseased.  Second diagonal  branch was small, diffusely diseased with an 80% mid vessel lesion.  Both  vessels were less than 1.5 mm.   Circumflex coronary artery was essentially normal.  There were stents in the  mid vessel which were widely patent.   Right coronary artery was dominant.  There were widely patent stents in the  mid vessel.  The PDA and PLA were normal.   RAO VENTRICULOGRAPHY:  RAO ventriculography was normal.  EF was 45-50%.  There was interior apical hypokinesis.  Aortic pressure was 167/81.  LV  pressure was 165/19.   I will review the films with Dr. Gerri Spore to see if he thinks intervention  in the proximal LAD is warranted.  I suspect medical therapy will be  indicated.   The patient will have to watch her diabetes carefully.  I suspect some of  her volume status has to do with poor control of her diabetes and her Actos.       PN/MEDQ  D:  10/21/2004   T:  10/21/2004  Job:  [id number]

## 2011-04-25 NOTE — Cardiovascular Report (Signed)
NAME:  FELICIANA, NARAYAN NO.:  [id number]   MEDICAL RECORD NO.:  [id number]          PATIENT TYPE:  INP   LOCATION:  6525                         FACILITY:  [facility]   PHYSICIAN:  Carole Binning, M.D. LHCDATE OF BIRTH:  14-May-1951   DATE OF PROCEDURE:  10/08/2004  DATE OF DISCHARGE:                              CARDIAC CATHETERIZATION   PROCEDURE PERFORMED:  1.  Selective coronary angiography of the left circumflex coronary artery      and the right coronary artery.  2.  PTCA with placement of bare metal stents x2 in the mid and distal left      circumflex coronary artery.   INDICATIONS:  Ms. Deutschman is a 60 year old woman who presented to [facility] seven days ago with an acute lateral wall myocardial infarction.  She underwent PTCA of the left circumflex at that time by Dr. Riley Kill with  placement of a drug-eluting stent in the mid circumflex.  There was also a  significant stenosis in the distal circumflex which was treated with PTCA.  At that time Dr. Riley Kill had difficulty advancing balloons to the site of  the distal lesion.  Because of the acute situation, he was not able to place  a stent in the more distal area of the circumflex.  When he finished the  case there was dissection in the distal vessel, but good flow.  It was  therefore opted to treat the patient medically.  The patient then returned  two days later for staged intervention of the right coronary artery which  was performed by Dr. Juanda Chance with placement of three drug-eluting stents in  the right coronary artery.  Over the next two days she had recurrent  episodes of substernal chest pain.  It was felt that this was most likely  due to the dissection and residual stenosis in the distal left circumflex.  The patient therefore returned to the laboratory today for relook  catheterization and possible percutaneous coronary intervention.   PROCEDURAL NOTE:  (The diagnostic catheterization was  performed by Dr. Nicholes Mango.)  A 6-French sheath was placed in the right femoral artery.  We  initially performed diagnostic catheterization of the left coronary artery  with a 6-French JL4 catheter, in the right coronary artery 6-French JR4  catheter.  The left circumflex injections revealed the presence of  dissection in the distal circumflex with 95% stenosis at the dissection  site.  There was the 70% stenosis proximally to that extending up to the  stented segment of the mid circumflex.  The right coronary artery was widely  patent at the stent sites.  We therefore proceeded with percutaneous  intervention to the left circumflex.  Angiomax was administered per  protocol.  We used a 6-French CLS3 guiding catheter.  We advanced two Luge  wires under fluoroscopic guidance beyond the dissection site into the distal  circumflex.  We used two wires in order to do a buddy wire technique in  advancing our equipment.  We then performed PTCA of the distal left  circumflex with a 2.25 x 15 mm  Quantum balloon inflated to 12 atmospheres.  We then advanced a 2.25 x 15 mm Mini Vision stent over the wire and  positioned this across the area of dissection in the distal circumflex.  We  then pulled back the Luge wire we were not utilizing.  We deployed that  stent at 9 atmospheres.  We then went ahead with a 2.25 x 15 mm Quantum  balloon and post dilated the stent to 15 atmospheres.  Following this we  attempted to advance a 2.5 x 20 mm Taxus stent to cover the area of 70%  stenosis between the previously placed stent and the stent we just placed in  the distal vessel.  However, we were unable to advance this stent to the  lesion.  During this time we had to reposition our guidewires on a number of  occasions because of movement of the guidewires during attempts to advance  our stent.  We then went in with a 2.25 x 18 mm Mini Vision stent and were  also unable to get this stent distal enough to  cover the gap and overlap  with the more distal stent.  We therefore went in with our 2.25 x 15 mm  Quantum balloon and performed multiple balloon inflations in the gap between  the stents to 15 and 17 atmospheres.  Following this we readvanced our 2.25  x 18 mm Mini Vision stent and we were able to advance this stent into the  more distal stent so we positioned it such that there was minimal overlap  both with the more distal stent and the more proximal stent.  We deployed  this stent at 14 atmospheres.  We deployed the stent at approximately 18  atmospheres.  We then advanced the balloon into the area of overlap of this  stent and the more distal stent and inflated again to 14 atmospheres.  We  then positioned in the more proximal stent area of overlap and inflated to  18 atmospheres.  Finally, we went in with a 2.75 x 20 mm Quantum balloon and  positioned this in the Taxus stent placed by Dr. Riley Kill, inflated this to  20 atmospheres in the proximal portion of the stent and then 17 atmospheres  in the area of the stent overlap.  Intermittent doses of intracoronary  nitroglycerin and verapamil were administered.  Final angiographic images  were obtained revealing patency of the left circumflex with 0% residual  stenosis at the stent site and TIMI 3 flow.   COMPLICATIONS:  None.   RESULTS:  Complex, but successful PTCA with placement of two bare metal  stents in the mid and distal left circumflex.  A 70% followed by a 95%  stenosis with dissection were both reduced to 0% residual with TIMI 3 flow.   PLAN:  Angiomax will be discontinued.  Patient will be continued on the  TRITAN study drug per protocol x1 year.       MWP/MEDQ  D:  10/08/2004  T:  10/08/2004  Job:  [id number]   cc:   Gordy Savers, M.D. [facility]   Cath Lab

## 2011-04-25 NOTE — Discharge Summary (Signed)
NAME:  Shelby Lyons, WIND NO.:  [id number]   MEDICAL RECORD NO.:  [id number]          PATIENT TYPE:  OIB   LOCATION:  6523                         FACILITY:  [facility]   PHYSICIAN:  Charlies Constable, M.D. [facility] DATE OF BIRTH:  10-Jan-1951   DATE OF ADMISSION:  04/25/2005  DATE OF DISCHARGE:  04/26/2005                                 DISCHARGE SUMMARY   CARDIOLOGIST:  Charlton Haws, M.D.   DISCHARGE DIAGNOSIS:  Status post cardiac catheterization with percutaneous  coronary intervention of right coronary artery, continuing Triton study  drugs.   PAST MEDICAL HISTORY:  1.  Status post stress test on Apr 22, 2005.  Positive EKG changes with 1-2      mm inferolateral ST depression recovery, perfusion imaging revealing a      large prior anterior apical infarct with moderately severe ischemia in      the inferior wall calculi, ejection fraction of 46%.  2.  History of coronary artery disease.      1.  Status post acute lateral wall myocardial infarction with          percutaneous transluminal coronary angioplasty and          __________circumflex artery x2 in October of 2005.      2.  Subsequent elective bare metal of circumflex x3 in November of 2005.      3.  Status post inferior myocardial infarction/percutaneous transluminal          coronary angioplasty of left anterior descending artery in 1994.      4.  Mid left ventricular dysfunction with ejection fraction of 45-50% by          cardiac cath in November of 2005.  3.  Insulin requiring diabetes.  4.  Hypertension.  5.  Hyperlipidemia.  6.  Obesity.  7.  Gastroesophageal reflux disease.  8.  History of pain disorder.  9.  History of congestive heart failure.  10. Status post appendectomy.  11. Status post total hysterectomy.   DISPOSITION:  The patient is being discharged home with instructions to  continue her previous medications, including:  1.  Triton study drugs.  2.  Aspirin 325.  3.  Lipitor 40.  4.   Accupril 40.  5.  Nexium 40.  6.  Zoloft 100.  7.  Lopressor 12.5 mg p.o. b.i.d.  8.  Accupril 40 mg daily.  9.  Lantus 60 units at bedtime.  10. Potassium 20 mEq daily.  11. Lasix 40 mg in the a.m. and 20 mg in the p.m.  12. The patient has been instructed not to resume Glucophage until Monday      a.m.   PAIN MANAGEMENT:  1.  Tylenol for general discomfort.  2.  Nitroglycerin for chest pain.   Activity, diet, wound care, special instructions per discharge instructions  after cardiac catheterization for [facility].   FOLLOW UP:  Follow up with Dr. Eden Emms in 1-2 weeks.  Our office will call  the patient for appointment.      MB/MEDQ  D:  04/26/2005  T:  04/26/2005  Job:  [id number]

## 2011-04-25 NOTE — Discharge Summary (Signed)
NAME:  Shelby Lyons, Shelby Lyons NO.:  [id number]   MEDICAL RECORD NO.:  [id number]          PATIENT TYPE:  INP   LOCATION:  6525                         FACILITY:  [facility]   PHYSICIAN:  Dorian Pod, NP    DATE OF BIRTH:  Dec 03, 1951   DATE OF ADMISSION:  10/02/2004  DATE OF DISCHARGE:                                 DISCHARGE SUMMARY   ADDENDUM TO DISCHARGE SUMMARY:  Prior to discharge this morning, when the  patient initially sat up, she complained of sudden onset of shortness of  breath.  Was called to see patient.  At time of assessment, the patient was  sitting in chair, denies shortness of breath.  No acute distress.  Lungs are  clear to auscultation.  Cardiac S1, S2 regular rate and rhythm.  The patient  states she had sudden onset of shortness of breath, resolved immediately.  I  got patient up in hall, ambulated with me.  Pulse oximetry with activity 97%  on room air.  Chest x-ray ordered.  No acute findings.  The patient denied  any chest pain.  Proceed with discharge early this afternoon.       MB/MEDQ  D:  10/09/2004  T:  10/09/2004  Job:  [id number]

## 2011-04-25 NOTE — Cardiovascular Report (Signed)
NAME:  Shelby Lyons, Shelby Lyons NO.:  [id number]   MEDICAL RECORD NO.:  [id number]          PATIENT TYPE:  INP   LOCATION:  2923                         FACILITY:  [facility]   PHYSICIAN:  Charlies Constable, M.D. [facility] DATE OF BIRTH:  1951-10-14   DATE OF PROCEDURE:  10/04/2004  DATE OF DISCHARGE:                              CARDIAC CATHETERIZATION   CLINICAL HISTORY:  Ms. Anastos is 60 years old and had primary PTCA of the LAD  for an anterior wall infarction with __________ recovery of her LV function.  She presented to [facility] recently on October 26 with chest pain  and lateral ST elevation.  She was transferred here and underwent stenting  of the circumflex artery by Dr. Riley Kill.  She was brought back for  intervention on the right coronary artery today.  [place]   PROCEDURE:  The procedure was performed via the left femoral artery using an  arterial sheath.  We first took pictures of the left coronary artery to  evaluate the stent placed by Dr. Riley Kill.  We then approached the right  coronary artery.   The patient was on an Integrilin drip and was given weight-adjusted heparin  to prolong the ACT greater than 200 seconds.  She has been on Triton study  drug which is either Plavix or a study platelet inhibitor.   We initially used an AL1 6-French guiding catheter with side holes to  provide extra support since the vessel was quite tortuous and we thought  access to the lesion would be difficult.  We crossed the lesion in the  proximal and mid and distal right coronary arteries with an Public relations account executive without difficulty.  We predilated the distal lesion with a 2.75 x 15  mm Quantum Maverick with one inflation up to 10 atmospheres for 30 seconds.  We also predilated the proximal lesion with the same balloon with two  inflations up to 8 atmospheres for 30 seconds.  We then deployed a 2.5 x 18  mm Cypher stent in the distal right  coronary artery.  We chose a 2.5 x 18 mm  Cypher stent because this length was best for this lesion.  She had previous  Taxus stents and we planned to use Taxis stents in the proximal vessel  because of the tortuosity but a 16 was too short and a 20 was too long and  would have ended in a diseased area.  We deployed the Cypher stent with one  inflation of 14 atmospheres for 30 seconds.  We then deployed a 2.75 x 13 mm  Taxus in the mid to proximal vessel deploying this with one inflation up to  14 atmospheres for 30 seconds.  We then post dilated the distal stent with a  3.0 x 15 mm Quantum Maverick performing one inflation up to 16 atmospheres  for 30 seconds.  We then post dilated the stent in the proximal and mid  right coronary artery with a 3.0 x 15 mm Quantum Maverick performing  multiple inflations up to 18 atmospheres for 30 seconds.  There was disease  proximal to the proximal edge of the Taxus stent in the proximal right  coronary artery.  We exchanged for a JR4 guiding catheter so we could better  see this portion of the vessel and not overlap it with our guiding catheter.  We recrossed the lesion with the Prowater wire.  We then deployed a 3.0 x 16  mm Taxus stent proximal to and overlapping the Taxus stent in the mid to  proximal vessel.  We deployed this with one inflation of 16 atmospheres for  30 seconds.  We then post dilated this stent as well as the Taxus stent in  the mid vessel with a 3.5 x 15 mm Quantum Maverick performing multiple  inflations up to 20 atmospheres for 30 seconds.  There was one area in the  bend in the mid portion of the vessel which did not quite completely expand  with a 3.5 balloon to 20 atmospheres but the overall results still looked  quite good.  The patient tolerated procedure well and left the laboratory in  satisfactory condition.   RESULTS:  Initially, the stenosis in the distal right coronary artery was  90% and following stenting this  improved to 0%.  The stenosis in the mid and  proximal right coronary artery were 90% and this improved to less than 10%.   CONCLUSION:  1.  Successful stenting of the lesion in the distal right coronary artery      with a Cypher drug-eluting stent with improvement in center of narrowing      from 90% to 0%.  2.  Successful placement of tandem overlying Taxus stents in the proximal      and mid right coronary artery with improvement in center of narrowing      from 90% to less than 10%.   DISPOSITION:  Patient returned to postanesthesia care unit for further  observation.       BB/MEDQ  D:  10/04/2004  T:  10/04/2004  Job:  [id number]   cc:   Gordy Savers, M.D. [facility]   Maisie Fus D. Riley Kill, M.D. [facility]   CP Lab

## 2011-04-25 NOTE — Cardiovascular Report (Signed)
NAME:  Shelby Lyons, Shelby Lyons NO.:  [id number]   MEDICAL RECORD NO.:  [id number]          PATIENT TYPE:  INP   LOCATION:  2852                         FACILITY:  [facility]   PHYSICIAN:  Arturo Morton. Riley Kill, M.D. [facility] OF BIRTH:  [birth year]   DATE OF PROCEDURE:  10/02/2004  DATE OF DISCHARGE:                              CARDIAC CATHETERIZATION   INDICATIONS:  The patient presented to the [facility] emergency room with  acute lateral wall infarction by EKG.  She was brought emergently to the  cardiac catheterization laboratory at [facility] where her chest pain was  improved, and ST's appeared to be resolved.  Urgent catheterization was  recommended.   PROCEDURES:  1.  Left-heart catheterization.  2.  Selective coronary arteriography.  3.  Selective left ventriculography.  4.  PTCA and stenting of the mid circumflex coronary artery.  5.  PTCA of the obtuse marginal 2.   DESCRIPTION OF PROCEDURE:  The patient was brought to the catheterization  laboratory and prepped and draped in the usual fashion.  Through an anterior  puncture, the right femoral artery was easily entered.  A 6-French sheath  was initially placed.  We did views of the left and right coronary arteries  in the central aortic and left ventricular pressures were measured with a  pigtail, and ventriculography was performed in the RAO projection.  Following this, pressure pullback was performed.  I then reviewed the films  with Dr. Emilie Rutter. Pulsipher, and we discussed possible strategies.  With  lateral ST elevation predominantly, it was felt that the circumflex was  likely the acute infarct vessel, although there was haziness in the distal  right coronary.  We elected to defer intervention of the right coronary  artery and proceed with circumflex intervention.  With this, a JL-3 guiding  catheter was utilized.  Heparin was given according to protocol.  The  patient was already on an Integrilin drip.  A traverse  wire was placed down  into the distal vessel and pre dilatation done with 2.5 mm balloons.  There  was quite a bit of difficulty getting the balloon down to the distal site.  The 12 mm Maverick 2.5 mm balloon could barely track down into this area, so  we knew that it would be difficult to pass a stent.  We pre dilated both the  lesions, and there was a fair amount of improvement in both of these sites.  A 2.5 x16 Taxus stent was then carefully manipulated down the artery, and  this also was quite difficult getting around first the bend, and then  tracking down the vessel.  We were able to get it to the high grade proximal  lesion, and the stent was taken up to 15 atmospheres.  A balloon was then  again taken down to the distal vessel and inflations done for 4 and 3  minutes. There was marked improvement in the appearance of the artery.  All  catheters were subsequently removed, and the femoral sheath was sewn into  place.   She was taken to the CCU in satisfactory clinical  condition.   HEMODYNAMIC DATA:  1.  Central aortic pressure 124/74.  2.  Left ventricle 146/15.  3.  No gradient on pullback across the aortic valve.   ANGIOGRAPHIC DATA:  1.  Ventriculography in the RAO and LAO projections suggested real overall      well-preserved left ventricular function with minimal anterolateral      hypokinesis.  2.  The left main was free of critical disease.  3.  The LAD and the previous site of angioplasty is segmentally diseased      with about 30-40% narrowing.  The vessel then tapers distally without      significant high-grade disease.  The second diagonal has a tandem 70%      area of disease.  4.  There is a ramus intermedius that is free of significant disease.  5.  The circumflex has a 90% stenosis in the mid portion which is stented to      0% with no edge tear.  Just beyond this, there is about 40-50% narrowing      of segmental disease into the second marginal branches, a 90%  area of      stenosis on a bend that is reduced to about 40 to 50% with evidence of      some vessel dissection due to angioplasty.  There was, however,      excellent TIMI 3 flow into the distal vessel.  6.  The right coronary artery has a long area of 60-70% segmental disease      and a 50% stenosis, and a 95% stenosis prior to the takeoff of the PDA.   CONCLUSIONS:  1.  Preserved overall left ventricular function.  2.  Acute lateral infarction probably due to circumflex involvement with      successful stenting of the mid circumflex and PTCA of the distal OM2      lesion.  3.  High-grade residual disease of the right coronary artery requiring      subsequent intervention.   PLAN:  1.  The patient will be brought back to the cardiac catheterization      laboratory on Friday.  2.  The patient was enrolled in the Triton study, and will receive oral      antiplatelet agents according to this protocol.       TDS/MEDQ  D:  10/02/2004  T:  10/02/2004  Job:  [id number]   cc:   CV Laboratory

## 2011-04-25 NOTE — Assessment & Plan Note (Signed)
[facility]                            CARDIOLOGY OFFICE NOTE   Shelby Lyons, Shelby Lyons                      MRN:          [id number]  DATE:03/08/2007                            DOB:          1951/12/07    Shelby Lyons returns today in followup.  She has had multiple previous  interventions and an anterior wall MI.  She also has an anxiety  disorder, and this seems to be improved.  Unfortunately her diabetes is  not well controlled.  She continues to follow up with Dr. Amador Cunas  for this.  Her insulin has been increased recently.  [name] needs a  followup stress test.  Her EF has been in the 45% range.  She actually  may be a reasonable candidate for the DETERMINE trial.  We will see how  her stress test looks before making this determination.  In general, her  EFs have been greater than 35%.  Her last echocardiogram was done Apr 14, 2006, and at that time it was 45%.   MEDICATIONS:  Her medication are numerous and include:  1. An aspirin a day.  2. Lantus as directed.  3. Zoloft 100 a day.  4. NovoLog sliding scale.  5. K-Dur 20 a day.  6. Glucophage 1 g b.i.d.  7. Metoclopramide 10 b.i.d.  8. Lipitor 80 daily.  9. Accupril 20 a day.  10.Nexium 40 a day.  11.Lasix 40 b.i.d.  12.Lopressor 12.5 b.i.d.   REVIEW OF SYSTEMS:  Remarkable for trace lower extremity edema.  She has  had occasional blurred vision related to her sugar.  She has not had  significant chest pain.  She has some chronic low-level exertional  dyspnea.   PHYSICAL EXAMINATION:  GENERAL:  She is overweight.  VITAL SIGNS:  Blood pressure 120/68, pulse 56 and regular.  HEENT:  Normal.  NECK:  Carotids normal without bruit.  LUNGS:  Clear.  HEART:  S1, S2 with distant heart sounds.  ABDOMEN:  Benign.  EXTREMITIES:  Lower extremities have intact pulses, no edema.   IMPRESSION:  History of anterior wall myocardial infarction with  multiple previous interventions.  Followup  Myoview.  The patient will  continue her aspirin and beta blocker.   We will see if she is a candidate for the DETERMINE trial based on her  initial Myoview study.  Her EKG today showed sinus rhythm with somewhat  poor R wave progression.  It is good that the patient is not in any  significant angina; however, I told her that she really does need to  work on her diet and to lose weight as well as follow stricter diabetes  control. Her hemoglobin A1c's have been greater than 8; her target would  be in the 6-7 range.   She will continue to follow up with Dr. Amador Cunas for this.  I will  see her back in 3-6 months as long as her Myoview is low risk.     Noralyn Pick. Eden Emms, MD, [facility]  Electronically Signed    PCN/MedQ  DD: 03/08/2007  DT: 03/08/2007  Job #: [id number]

## 2011-04-25 NOTE — Discharge Summary (Signed)
NAME:  Shelby Lyons, Shelby Lyons NO.:  [id number]   MEDICAL RECORD NO.:  [id number]          PATIENT TYPE:  INP   LOCATION:  6525                         FACILITY:  [facility]   PHYSICIAN:  Charlton Haws, M.D.     DATE OF BIRTH:  1951-09-28   DATE OF ADMISSION:  10/02/2004  DATE OF DISCHARGE:  10/09/2004                                 DISCHARGE SUMMARY   BRIEF HISTORY:  This is a 60 year old female with known coronary artery  disease who presents in transfer from [facility] with an acute  lateral MI.  The patient had onset of symptoms at approximately 3 p.m. on  the day of admission associated with significant shortness of breath,  diaphoresis, and substernal chest pain radiating to her back.  She was found  to be in congestive heart failure at [facility].  She was treated  with Lasix as well as IV heparin and nitroglycerin, Integrilin, Lopressor,  aspirin, and Ativan.  She was pain-free on arrival.  She was taken directly  to the catheterization laboratory.   PAST MEDICAL HISTORY:  1.  Anterior MI in 1994 with percutaneous intervention.  2.  She had a cardiac catheterization in 1998 that revealed a 30% lesion at      the previous PCI site.  Her ejection fraction was 55%.  3.  Diabetes mellitus.  4.  Hypertension.  5.  Elevated lipids.  6.  Obesity.  7.  Panic disorder.  8.  Gastroesophageal reflux disease.  9.  Status post total abdominal hysterectomy and bilateral salpingo-      oophorectomy.  10. Status post appendectomy.   ALLERGIES:  SULFA, PENICILLIN.   SOCIAL HISTORY:  The patient lives in [place].  She has not smoked for 30  years.  She does not use alcohol.   FAMILY HISTORY:  Significant for the fact that her father died at age 32  from an MI.  She has a sister with hypertension.   HOSPITAL COURSE:  As noted, this patient presented with an acute MI.  She  was taken to the cardiac catheterization laboratory by Dr. Riley Kill.  She was  found to  have a 90% circumflex lesion.  A stent was placed reducing the  lesion to 0%.  Her LV was normal.  At that time she also was noted to have a  95% RCA lesion.  She also had a distal PTCA of a 90% circumflex lesion  reducing it to 50%.  Please see Dr. Rosalyn Charters complete dictated report for  full details.  It was felt that she would require further intervention of  the RCA prior to discharge.   The patient was found to be hypokalemic.  This was supplemented.  On October 04, 2004 she was taken back to the catheterization laboratory by Dr. Charlies Constable.  She had three stents placed in her right coronary artery.  There  were no lesions both 90%, one reduced to 0, one reduced to 10%.  She was  enrolled in the TRITON study.   The patient continued to have chest pain.  A 2-D  echocardiogram was  performed on October 08, 2004.  This revealed an ejection fraction of 45-50%  with akinesis of the anterior septal wall.  There was mild tricuspid  regurgitation.  The aortic valve was mildly calcified.   On October 08, 2004 the patient was taken back to the catheterization  laboratory where she had further percutaneous interventions.  She had PTCA  with placement of bare metal stents x2 in the mid and distal left circumflex  artery.  Please see dictated report for full details.   The patient was seen by Dr. Eden Emms the following day and arrangements were  made for discharge.   LABORATORY DATA:  On the day of discharge CBC revealed hemoglobin 10.4,  hematocrit 29.6, WBC 6000, platelets 269,000.  A chemistry profile on that  day revealed a BUN of 15, creatinine 1.0, potassium 4.2, glucose 191.  Cardiac enzymes on the first were significant for a CK of 85, MB 7.1.  On  October 27 her CK was 193, MB 12.3, troponin 4.47.  These enzymes peaked  later that day with a CK of 330, MB 24.6, troponin 6.7.  Please see  echocardiogram as noted above.  A lipid profile on the 28th revealed  cholesterol 165,  triglycerides 149, HDL 32, LDL 103.  TSH was 1.746.  Hemoglobin A1C was 9.2.  A chest x-ray showed no active disease.   DISCHARGE MEDICATIONS:  1.  Metoprolol 12.5 mg q.8h.  2.  Coated aspirin 325 mg daily.  3.  Lantus 80 units at bedtime.  4.  Actos 15 mg daily.  5.  Nexium 40 mg daily.  6.  Glucophage was to be restarted on Monday, October 31 at her previous      dose 500 mg daily.  7.  Metoclopramide 10 mg b.i.d.  8.  Zoloft 100 mg daily.  9.  Quinapril 40 mg daily.  10. Lipitor 40 mg daily.  She had previously been on 20 mg.  11. TRITON study drug.  12. Nitroglycerin p.r.n.  13. Hydrochlorothiazide was placed on hold.  14. She was told she could take Tylenol as needed for pain.   She was not to drive or do anything strenuous for at least five days.  She  was not to return to work until cleared by her physician.  She was to be on  a low salt, low fat diabetic diet.  She was told to call the office if she  had any problems with her catheterization site.  She was to have a basic  metabolic panel and CBC at her next office visit.  She was to have lipids  and liver profile when seen by Dr. Eden Emms.  Patient was told to follow up  with Dr. Amador Cunas as needed.  She was to see the physician's assistant  November 14 at 1 p.m.  She would see Dr. Eden Emms in December.   PROBLEM LIST:  1.  Acute anterior myocardial infarction with PTCA stenting of the      circumflex on October 26, PTCA of the right coronary artery with      stenting on October 28, and further PTCA stenting of the circumflex on      October 08, 2004.  Please see details as noted above.  2.  History of coronary artery disease with previous anterior myocardial      infarction and PTCA 1994.  3.  Echocardiogram this admission revealing mildly low ejection fraction in      the 45% range.  Please see details above.  4.  Diabetes mellitus.  5.  Hypertension. 6.  Elevated lipids.  Lipitor increased this admission.  7.   Obesity.  8.  Panic disorder.  9.  Gastroesophageal reflux disease.  10. Status post total abdominal hysterectomy, bilateral salpingo-      oophorectomy.  11. Status post appendectomy.  12. Congestive heart failure associated with her myocardial infarction.  13. Remote tobacco use.  14. Mild anemia.  15. Hypokalemia, corrected.   As noted, the patient will need a BMET and a CBC at her next office visit.  She will need lipids and a liver profile in six weeks.  Her  hydrochlorothiazide is on hold for now.       DR/MEDQ  D:  10/09/2004  T:  10/09/2004  Job:  [id number]   cc:   Gordy Savers, M.D. [facility]

## 2011-04-25 NOTE — H&P (Signed)
NAME:  Shelby Lyons, Shelby Lyons NO.:  [id number]   MEDICAL RECORD NO.:  [id number]          PATIENT TYPE:  INP   LOCATION:  4729                         FACILITY:  [facility]   PHYSICIAN:  Vida Roller, M.D.   DATE OF BIRTH:  August 15, 1951   DATE OF ADMISSION:  10/19/2004  DATE OF DISCHARGE:                                HISTORY & PHYSICAL   PHYSICIANS:  Primary care physician: Gordy Savers, M.D.  Cardiologist:  Charlton Haws, M.D.   HISTORY OF PRESENT ILLNESS:  Ms. Degrave is a 60 year old woman who has  significant severe coronary artery disease status post percutaneous  revascularization of all three of her major coronary arteries.  She presents  to the emergency department at [facility] with acute shortness of  breath. She states she was sitting in her kitchen this evening at about  11:30, started to feel a little short of breath, decided to go to sleep.  Was able to go to sleep but then was awakened around 2 o'clock in the  morning with progressive difficulty breathing.  She called her son who is a  paramedic.  He came to see her.  By the time he saw her, she was in acute  extremis with difficulty with shortness of breath.  She was brought to the  emergency department at [facility] where she received IV Lasix and  nitroglycerin and had significant diuresis and much improvement in her  shortness of breath to the point now where she is essentially asymptomatic,  lying comfortably in bed.  She states that she has some discomfort in the  center of her chest and still has some residual lateral discomfort from the  difficulty breathing, but this was not characteristic for her angina.   She denies any recent illness, no PND or orthopnea.  She states that prior  to last night, she actually felt quite well and was able to do most of her  activity.   PAST MEDICAL HISTORY:  1.  Coronary disease.  She is status post an acute anterior wall myocardial  infarction in 1994 with percutaneous revascularization of her left      anterior descending coronary artery.  I believe at that time she did not      receive a stent.  She subsequently presented in October of this year      with acute anterolateral myocardial infarction and underwent      revascularization of both her circumflex and right coronary artery with      both drug-eluting and non-drug-eluting stents, and she tolerated that      procedure well.  She has normal left ventricular systolic function on      her last heart catheterization.  2.  Diabetes which is insulin requiring.  3.  Hypertension, and she was hypertensive upon presentation to the      emergency department.  4.  Hyperlipidemia.  5.  Obesity.  6.  History of panic disorder.  7.  Gastroesophageal reflux disease.   ALLERGIES:  She is allergic to SULFA and PENICILLIN.   SOCIAL HISTORY:  She lives in  [place].  She does not smoke, although she  has about a 10-pack-year smoking history and quit about 30 years ago.  She  does not drink any alcohol or use any illicit drugs.   FAMILY HISTORY:  Significant for her father having a coronary event and  dying at the age of 37.  She has a sister who has hypertension.   CURRENT MEDICATIONS:  1.  Metoprolol 12.5 mg t.i.d.  2.  Coated aspirin 325 mg once a day.  3.  Lantus insulin 80 units at bedtime.  4.  Actos 50 mg a day.  5.  Nexium 40 mg a day.  6.  Glucophage 500 mg a day.  7.  Metoclopramide 10 mg twice a day.  8.  Zoloft 100 mg a day.  9.  Quinapril 40 mg a day.  10. Lipitor 40 mg a day.  11. TRITON study drug.  12. Nitroglycerin as needed.   REVIEW OF SYSTEMS:  Essentially noncontributory except for that reviewed in  History of Present Illness.   PHYSICAL EXAMINATION:  GENERAL:  She is a moderately obese white female in  no apparent distress.  She is alert and oriented x 4.  VITAL SIGNS:  Heart rate 86, respiratory rate 14, blood pressure 140/70.  HEENT:   Examination of head, eyes, ears, nose, and throat is unremarkable.  NECK:  Supple.  There is no obvious jugular venous distention or carotid  bruits.  Thyroid is normal size and midline.  CHEST:  Decreased breath sounds at the bases but actually reasonably good  air movement.  There is no obvious rales.  CARDIAC:  Distant but no obvious murmurs.  ABDOMEN:  Soft, nontender, with normoactive bowel sounds.  EXTREMITIES:  Her lower extremities have just trivial edema at her ankles.  Her pulses are 2+.  The right groin looks well healed, and there is no  significant ecchymosis and no bruit.  The left groin has a relatively large  area of ecchymoses which is resolving, and there is no bruit there as well.   LABORATORY AND X-RAY DATA:  Blood gas showed a pH of 7.44, PCO2 of 37, PO2  of 64.  B-natruretic peptide was 142.  Sodium 140, potassium 3.9, chloride  102, bicarb 22, BUN 15, creatinine 1.1, and blood sugar 210.  Liver function  studies were within normal limits.  CK 56, troponin 0.01.  UA shows protein  but is otherwise within normal limits.  PT 12.1, PTT 26.2.  White blood cell  count 8.6, hemoglobin 13, hematocrit 40, platelet count 465.   Chest x-ray shows pulmonary edema with mild cardiomegaly.   A single set of cardiac enzymes is not consistent with acute myocardial  infarction.   Electrocardiogram shows sinus rhythm at rate of 85 with Q waves in the  inferior leads consistent with an old inferior wall myocardial infarction,  poor R wave progression consistent with an old anterior wall myocardial  infarction, minimal ST changes in the anterolateral leads.  I have no old  EKGs for comparison.   IMPRESSION:  This is a woman who presents to the hospital with acute  pulmonary edema which is characteristic for flash pulmonary edema by  history.  Fortunately it was responsive to IV Lasix, and she has done  reasonably well.  She does have congestive heart failure on chest x-ray. Her  electrocardiogram is not overwhelmingly concerning, however.  She does  have known severe coronary disease.  She has had intervention to all three  of her major coronary arteries.  Her left ventricular function, however, is  normal.   Diabetes is insulin requiring.  She does have hypertension, and she was  hypertensive upon presentation.  The concern obviously is that this may be a  hypertensive urgency, although blood pressure was not to that level.  She  has hyperlipidemia, obesity, and a history of a panic disorder.   PLAN:  1.  Admit her.  2.  Cycle enzymes.  3.  She will need an echocardiogram to assess her left ventricular systolic      function.  4.  Depending on the results of her enzymes, she may need a re-look      catheterization.  5.  At this point, I am not going to put her on any Lasix at all just to see      how she does with blood pressure control on her medications.  If she      requires further Lasix, will check daily I's and O's and will evaluate      her further.      Trey Paula   JH/MEDQ  D:  10/19/2004  T:  10/19/2004  Job:  [id number]   cc:   Gordy Savers, M.D. [facility]   Charlton Haws, M.D.

## 2011-04-25 NOTE — Assessment & Plan Note (Signed)
[facility] OFFICE NOTE   SARAE, NICHOLES                      MRN:          [id number]  DATE:01/19/2007                            DOB:          1951/10/02    A 60 year old female seen today for followup.  She has not been seen in  greater than one year.  She has coronary artery disease, diabetes,  hypertension, dyslipidemia.  Complaints today include some mild  positional vertigo, also rash involving her left anterior leg.  Since  her last visit here has had cataract surgery.  She is followed mostly by  Cardiology.  Her cardiac status has been stable.   EXAMINATION:  Weight is 227, down 7 pounds.  Blood pressure low normal.  ENT:  Unremarkable, oropharynx clear.  NECK:  No bruits.  CHEST:  Clear.  CARDIOVASCULAR:  Normal heart sounds, no murmurs.  ABDOMEN:  Benign.  Examination of her left anterior leg revealed some mild erythema.   IMPRESSION:  1. Diabetes.  2. Coronary artery disease.  3. Hypertension.  4. Dyslipidemia.   DISPOSITION:  Laboratory update will be reviewed.  All medicines were  refilled.  Was given a prescription for triamcinolone for her  nonspecific dermatitis.     Gordy Savers, MD  Electronically Signed    PFK/MedQ  DD: 01/19/2007  DT: 01/19/2007  Job #: [id number]

## 2011-05-20 ENCOUNTER — Telehealth: Payer: Self-pay

## 2011-05-20 MED ORDER — GLUCOSE BLOOD VI STRP: 1.0000 | ORAL_STRIP | Freq: Three times a day (TID) | Status: DC

## 2011-05-20 NOTE — Telephone Encounter (Signed)
Faxed to [facility]  

## 2011-06-10 ENCOUNTER — Encounter: Payer: Self-pay | Admitting: Cardiovascular Disease

## 2011-10-09 ENCOUNTER — Encounter: Payer: Self-pay | Admitting: Cardiovascular Disease

## 2011-10-09 ENCOUNTER — Ambulatory Visit (HOSPITAL_COMMUNITY): Payer: BC Managed Care – PPO | Attending: Cardiovascular Disease | Admitting: Radiology

## 2011-10-09 ENCOUNTER — Ambulatory Visit (INDEPENDENT_AMBULATORY_CARE_PROVIDER_SITE_OTHER): Payer: BC Managed Care – PPO | Admitting: Cardiovascular Disease

## 2011-10-09 ENCOUNTER — Ambulatory Visit (INDEPENDENT_AMBULATORY_CARE_PROVIDER_SITE_OTHER)
Admission: RE | Admit: 2011-10-09 | Discharge: 2011-10-09 | Disposition: A | Discharge status: Home / Self Care | Payer: BC Managed Care – PPO | Source: Ambulatory Visit | Attending: Cardiovascular Disease | Admitting: Cardiovascular Disease

## 2011-10-09 ENCOUNTER — Encounter: Payer: Self-pay | Admitting: *Deleted

## 2011-10-09 DIAGNOSIS — R0600 Dyspnea, unspecified: Secondary | ICD-10-CM

## 2011-10-09 DIAGNOSIS — R0989 Other specified symptoms and signs involving the circulatory and respiratory systems: Secondary | ICD-10-CM | POA: Insufficient documentation

## 2011-10-09 DIAGNOSIS — E119 Type 2 diabetes mellitus without complications: Secondary | ICD-10-CM | POA: Insufficient documentation

## 2011-10-09 DIAGNOSIS — R011 Cardiac murmur, unspecified: Secondary | ICD-10-CM | POA: Insufficient documentation

## 2011-10-09 DIAGNOSIS — R1011 Right upper quadrant pain: Secondary | ICD-10-CM | POA: Insufficient documentation

## 2011-10-09 DIAGNOSIS — I10 Essential (primary) hypertension: Secondary | ICD-10-CM | POA: Insufficient documentation

## 2011-10-09 DIAGNOSIS — R0609 Other forms of dyspnea: Secondary | ICD-10-CM | POA: Insufficient documentation

## 2011-10-09 DIAGNOSIS — F411 Generalized anxiety disorder: Secondary | ICD-10-CM

## 2011-10-09 DIAGNOSIS — E785 Hyperlipidemia, unspecified: Secondary | ICD-10-CM | POA: Insufficient documentation

## 2011-10-09 DIAGNOSIS — I059 Rheumatic mitral valve disease, unspecified: Secondary | ICD-10-CM | POA: Insufficient documentation

## 2011-10-09 DIAGNOSIS — E669 Obesity, unspecified: Secondary | ICD-10-CM | POA: Insufficient documentation

## 2011-10-09 DIAGNOSIS — I079 Rheumatic tricuspid valve disease, unspecified: Secondary | ICD-10-CM | POA: Insufficient documentation

## 2011-10-09 NOTE — Assessment & Plan Note (Signed)
Well controlled.  Continue current medications and low sodium Dash type diet.    

## 2011-10-09 NOTE — Assessment & Plan Note (Signed)
Needs [place] and F/U with primary  Will try to arrange at [facility]

## 2011-10-09 NOTE — Assessment & Plan Note (Signed)
Discussed low carb diet.  Target hemoglobin A1c is 6.5 or less.  Continue current medications.  

## 2011-10-09 NOTE — Progress Notes (Signed)
60 yo poorly controlled diabets with CABG in January 01 2011 by [facility].  Presented with angina.  Cath by TS 3VD.  Had a sternal wound infection and was on prolonged antibiotics.  Wound thought to be superficial.  Saw Dr Ellin Saba assistant in [place].  Has had atypical chest pain.  Pain is actually RUQ and more in her ribs and abdomen.  No nausea or vohmiting No change in bowel habits.  Has had dyspnea.  No PND orthopnea.  No cough fever or sputum.  Dyspnea with exertion.  Had EF 45% prior to CABG.  DM under reasonable control.  Still overweight and anxious.  No CXR done in [place]  Reviewed office notes from [facility] since February   1. Coronary artery bypass grafting x3 (left internal mammary artery to       left anterior descending, saphenous vein graft to circumflex       marginal, saphenous vein graft to posterior descending).   2. Endoscopic harvest of right leg greater saphenous vein  ROS: Denies fever, malais, weight loss, blurry vision, decreased visual acuity, cough, sputum, SOB, hemoptysis, pleuritic pain, palpitaitons, heartburn, abdominal pain, melena, lower extremity edema, claudication, or rash.  All other systems reviewed and negative  General: Affect appropriate Healthy:  appears stated age HEENT: normal Neck supple with no adenopathy JVP normal no bruits no thyromegaly Lungs clear with no wheezing and good diaphragmatic motion Heart:  S1/S2 soft systolic murmur no ,rub, gallop or click PMI normal Abdomen: benighn, BS positve, no tenderness, no AAA no bruit.  No HSM or HJR Distal pulses intact with no bruits No edema Neuro non-focal Skin warm and dry No muscular weakness   Current Outpatient Prescriptions  Medication Sig Dispense Refill  . albuterol (PROAIR HFA) 108 (90 BASE) MCG/ACT inhaler Inhale 2 puffs into the lungs every 6 (six) hours as needed.        . ALPRAZolam (XANAX) 0.5 MG tablet Take 0.5 mg by mouth daily as needed.        Marland Kitchen aspirin (BAYER ASPIRIN) 325 MG tablet  Take 325 mg by mouth daily.        Marland Kitchen atorvastatin (LIPITOR) 80 MG tablet Take 80 mg by mouth daily.        . fluticasone (FLONASE) 50 MCG/ACT nasal spray Place into the nose as needed.        . furosemide (LASIX) 40 MG tablet Take one (1) tablet(s) twice daily  90 tablet  0  . glucose blood (ACCU-CHEK AVIVA PLUS) test strip 1 each by Other route 3 (three) times daily. Use as instructed  100 each  1  . insulin aspart (NOVOLOG PENFILL) 100 UNIT/ML injection Inject into the skin 2 (two) times daily before a meal. 12-20 units       . LANTUS 100 UNIT/ML injection Inject (SQ) 70 unit(s) at bedtime  3 vial  2  . metFORMIN (GLUCOPHAGE) 1000 MG tablet Take 1,000 mg by mouth 2 (two) times daily.        . metolazone (ZAROXOLYN) 10 MG tablet Take 10 mg by mouth 2 (two) times daily.        . metoprolol succinate (TOPROL-XL) 25 MG 24 hr tablet Take 25 mg by mouth 2 (two) times daily. Take 1/2 tab       . nitroGLYCERIN (NITROSTAT) 0.4 MG SL tablet Place 0.4 mg under the tongue every 5 (five) minutes as needed.        . potassium chloride SA (K-DUR,KLOR-CON) 20 MEQ tablet Take  20 mEq by mouth daily.        . quinapril (ACCUPRIL) 20 MG tablet Take 20 mg by mouth daily.        . sertraline (ZOLOFT) 100 MG tablet Take 100 mg by mouth daily.          Allergies  Review of patient's allergies indicates not on file.  Electrocardiogram:  NSR 73 poor R wave progression LAD  Assessment and Plan

## 2011-10-09 NOTE — Assessment & Plan Note (Signed)
Cholesterol is at goal.  Continue current dose of statin and diet Rx.  No myalgias or side effects.  F/U  LFT's in 6 months. Lab Results  Component Value Date   LDLCALC  Value: 108        Total Cholesterol/HDL:CHD Risk Coronary Heart Disease Risk Table                     Men   Women  1/2 Average Risk   3.4   3.3  Average Risk       5.0   4.4  2 X Average Risk   9.6   7.1  3 X Average Risk  23.4   11.0        Use the calculated Patient Ratio above and the CHD Risk Table to determine the patient's CHD Risk.        ATP III CLASSIFICATION (LDL):  <100     mg/dL   Optimal  161-096  mg/dL   Near or Above                    Optimal  130-159  mg/dL   Borderline  045-409  mg/dL   High  >811     mg/dL   Very High* 08/21/7828

## 2011-10-09 NOTE — Assessment & Plan Note (Signed)
Soft SEM old on exam.  Otherwise normal exam.  CXR and echo

## 2011-10-09 NOTE — Patient Instructions (Addendum)
Your physician wants you to follow-up in: 6 MONTHS You will receive a reminder letter in the mail two months in advance. If you don't receive a letter, please call our office to schedule the follow-up appointment.   ULTRASOUND OF ABDOMEN FOR RUQ PAIN=SCHEDULE AT [facility]  Your physician has requested that you have an echocardiogram. Echocardiography is a painless test that uses sound waves to create images of your heart. It provides your doctor with information about the size and shape of your heart and how well your heart's chambers and valves are working. This procedure takes approximately one hour. There are no restrictions for this procedure.   A chest x-ray takes a picture of the organs and structures inside the chest, including the heart, lungs, and blood vessels. This test can show several things, including, whether the heart is enlarges; whether fluid is building up in the lungs; and whether pacemaker / defibrillator leads are still in place. AT [address]

## 2011-10-09 NOTE — Assessment & Plan Note (Signed)
Stable with no angina and good activity level.  Continue medical Rx Pain is not anginal.  Compared ECG to ones in January and no signficant change

## 2011-10-09 NOTE — Assessment & Plan Note (Signed)
Stable has been worse in past Continue anxiolytics

## 2011-10-17 ENCOUNTER — Telehealth: Payer: Self-pay | Admitting: *Deleted

## 2011-10-17 NOTE — Telephone Encounter (Signed)
Pt made aware, results forwarded to dr Amador Cunas for his review. Shelby Lyons

## 2011-10-17 NOTE — Telephone Encounter (Signed)
Pt had a cxr on 10-15-11 at [facility], the impression reads, Diffuse peribronchial thickening and interstitial prominence, increased since prior study. This may reflect bronchitis and/or bronchiolitis. Edema felt less likely given the normal heart size. Will try to contact pt so she can follow up with her primary care doctor Deliah Goody

## 2011-12-10 ENCOUNTER — Encounter: Payer: Self-pay | Admitting: Cardiovascular Disease

## 2012-04-02 ENCOUNTER — Encounter: Payer: Self-pay | Admitting: Nurse Practitioner

## 2012-04-02 ENCOUNTER — Ambulatory Visit (HOSPITAL_COMMUNITY): Payer: BC Managed Care – PPO | Attending: Internal Medicine

## 2012-04-02 ENCOUNTER — Ambulatory Visit (INDEPENDENT_AMBULATORY_CARE_PROVIDER_SITE_OTHER): Payer: BC Managed Care – PPO | Admitting: Nurse Practitioner

## 2012-04-02 VITALS — BP 126/70 | HR 77 | Ht 65.0 in | Wt 209.0 lb

## 2012-04-02 DIAGNOSIS — I10 Essential (primary) hypertension: Secondary | ICD-10-CM | POA: Insufficient documentation

## 2012-04-02 DIAGNOSIS — I509 Heart failure, unspecified: Secondary | ICD-10-CM | POA: Insufficient documentation

## 2012-04-02 DIAGNOSIS — R609 Edema, unspecified: Secondary | ICD-10-CM | POA: Insufficient documentation

## 2012-04-02 DIAGNOSIS — R0609 Other forms of dyspnea: Secondary | ICD-10-CM | POA: Insufficient documentation

## 2012-04-02 DIAGNOSIS — I5030 Unspecified diastolic (congestive) heart failure: Secondary | ICD-10-CM

## 2012-04-02 DIAGNOSIS — R0989 Other specified symptoms and signs involving the circulatory and respiratory systems: Secondary | ICD-10-CM | POA: Insufficient documentation

## 2012-04-02 DIAGNOSIS — I498 Other specified cardiac arrhythmias: Secondary | ICD-10-CM | POA: Insufficient documentation

## 2012-04-02 DIAGNOSIS — I517 Cardiomegaly: Secondary | ICD-10-CM | POA: Insufficient documentation

## 2012-04-02 DIAGNOSIS — I252 Old myocardial infarction: Secondary | ICD-10-CM | POA: Insufficient documentation

## 2012-04-02 DIAGNOSIS — I2510 Atherosclerotic heart disease of native coronary artery without angina pectoris: Secondary | ICD-10-CM

## 2012-04-02 LAB — BASIC METABOLIC PANEL
BUN: 37 mg/dL — ABNORMAL HIGH (ref 6–23)
Creatinine, Ser: 1.6 mg/dL — ABNORMAL HIGH (ref 0.4–1.2)
GFR: 35.56 mL/min — ABNORMAL LOW (ref >60.00)
Glucose, Bld: 183 mg/dL — ABNORMAL HIGH (ref 70–99)

## 2012-04-02 LAB — CBC WITH DIFFERENTIAL/PLATELET
Basophils Relative: 0.7 % (ref 0.0–3.0)
Eosinophils Absolute: 0 10*3/uL (ref 0.0–0.7)
Eosinophils Relative: 0.4 % (ref 0.0–5.0)
HCT: 26.8 % — ABNORMAL LOW (ref 36.0–46.0)
Hemoglobin: 8.5 g/dL — ABNORMAL LOW (ref 12.0–15.0)
Lymphs Abs: 0.5 10*3/uL — ABNORMAL LOW (ref 0.7–4.0)
MCHC: 31.6 g/dL (ref 30.0–36.0)
MCV: 74.2 fl — ABNORMAL LOW (ref 78.0–100.0)
Monocytes Absolute: 0.4 10*3/uL (ref 0.1–1.0)
Neutro Abs: 6.7 10*3/uL (ref 1.4–7.7)
RBC: 3.62 Mil/uL — ABNORMAL LOW (ref 3.87–5.11)

## 2012-04-02 NOTE — Progress Notes (Signed)
Patient Name: Shelby Lyons Date of Encounter: 04/02/2012  Primary Care Provider:  Rogelia Boga, MD, MD Primary Cardiologist:  P. Eden Emms, MD  Patient Profile  61 y/o female with h/o CAD who presents for pre-op clearance.  Problem List   Past Medical History  Diagnosis Date  . CAD (coronary artery disease)     a. antlat MI 1994;  b. 9/05 Lat MI - DES LCX & RCA w/ subseq BMS to LCX 2/2 dissection;  c. 04/2005 DES to RCA;  d. 12/2010 CABG x 3: LIMA->LAD, VG->LCX, VG->PDA.  . Bradycardia     after cardioversion   . HTN (hypertension)   . Diastolic CHF, chronic     a. 10/2011 Echo:  EF 55-60% [place], Gr4 DD, irreversible restrictive pattern, Mod Dil LA  . DM2 (diabetes mellitus, type 2)   . Anxiety   . GERD (gastroesophageal reflux disease)   . Allergic rhinitis   . Obesity   . Abdominal pain, acute, right upper quadrant     a. GB dzs   Past Surgical History  Procedure Date  . Ptca of the lad 1994  . Cardiac catheterization Jan 1998  . Coronary angiography Nov 2005    with PTCA placement of bare-metal stents x 2 in the mild and distal left circumflex artery-  . Appendectomy   . Hysterectomy (other) 1998  . Bilateral salpingoophorectomy 1998    Allergies  Allergies  Allergen Reactions  . Penicillins   . Sulfa Drugs Cross Reactors     HPI  61 y/o female with the above problem list.  She was last seen in clinic in Nov 2012.  Following that visit, she had an Echo, which showed nl LV fxn and Gr 4 DD w/ question of irreversible restrictive pattern.  Since that visit, she has been having RUQ pain, for which, she has been eval by GI and general surgery in [place], and is tentatively planned for lap chole the first week of May.    Since November, her activity has been quite limited.  Per her and her husband, she spends most of the day sitting on the couch and watching TV.  She has noted progressive DOE since that time and now becomes SOB after walking across a  room.  She has not had any chest pain.  She does have mild LEE though she notes that this is dependent in nature and is not usually present first thing in the morning.  She weighs herself daily and her weight has been stable.  She denies pnd or orthopnea.  She has noted leg cramping the past 3 nights - it's been awhile since her last bmet.  Home Medications  Prior to Admission medications   Medication Sig Start Date End Date Taking? Authorizing Provider  albuterol (PROAIR HFA) 108 (90 BASE) MCG/ACT inhaler Inhale 2 puffs into the lungs every 6 (six) hours as needed.     Yes Historical Provider, MD  ALPRAZolam Prudy Feeler) 0.5 MG tablet Take 0.5 mg by mouth daily as needed.     Yes Historical Provider, MD  aspirin (BAYER ASPIRIN) 325 MG tablet Take 325 mg by mouth daily.     Yes Historical Provider, MD  atorvastatin (LIPITOR) 80 MG tablet Take 80 mg by mouth daily.     Yes Historical Provider, MD  fluticasone (FLONASE) 50 MCG/ACT nasal spray Place into the nose as needed.     Yes Historical Provider, MD  furosemide (LASIX) 40 MG tablet Take one (1) tablet(s)  twice daily 04/17/11  Yes Gordy Savers, MD  glucose blood (ACCU-CHEK AVIVA PLUS) test strip 1 each by Other route 3 (three) times daily. Use as instructed 05/20/11  Yes Gordy Savers, MD  insulin aspart (NOVOLOG PENFILL) 100 UNIT/ML injection Inject into the skin 2 (two) times daily before a meal. 12-20 units    Yes Historical Provider, MD  LANTUS 100 UNIT/ML injection Inject (SQ) 70 unit(s) at bedtime 01/25/11  Yes Gordy Savers, MD  metFORMIN (GLUCOPHAGE) 1000 MG tablet Take 1,000 mg by mouth 2 (two) times daily.     Yes Historical Provider, MD  metoprolol succinate (TOPROL-XL) 25 MG 24 hr tablet Take 25 mg by mouth 2 (two) times daily. Take 1/2 tab    Yes Historical Provider, MD  nitroGLYCERIN (NITROSTAT) 0.4 MG SL tablet Place 0.4 mg under the tongue every 5 (five) minutes as needed.     Yes Historical Provider, MD  potassium  chloride SA (K-DUR,KLOR-CON) 20 MEQ tablet Take 20 mEq by mouth daily.     Yes Historical Provider, MD  quinapril (ACCUPRIL) 20 MG tablet Take 10 mg by mouth daily.    Yes Historical Provider, MD  sertraline (ZOLOFT) 100 MG tablet Take 100 mg by mouth daily.     Yes Historical Provider, MD   Review of Systems As above, she reports DOE, dependent edema, & leg cramping.  No chest pain, n, v, dizziness, syncope, early satiety, dysuria, dark stools, blood in stools, diarrhea, rash/skin changes, fevers, chills, wt loss/gain.  Otherwise all systems reviewed and negative.  Physical Exam  Blood pressure 126/70, pulse 77, height 5\' 5"  (1.651 m), weight 209 lb (94.802 kg).  General: Pleasant, NAD Psych: Normal affect. Neuro: Alert and oriented X 3. Moves all extremities spontaneously. HEENT: Normal  Neck: Supple without bruits or JVD. Lungs:  Resp regular and unlabored, CTA. Heart: RRR no s3, s4, or murmurs. Abdomen: Soft, non-tender, non-distended, BS + x 4.  Extremities: No clubbing, cyanosis.  Trace bilat LE edema. DP/PT/Radials 2+ and equal bilaterally.  Accessory Clinical Findings  ECG - RSR, 77, LAD, twi I, aVL.  No acute st/t changes.  Assessment & Plan  1.  CAD:  S/P CABG last January.  Though she has not had any chest pain over the past year, she has had significant DOE over the past 6 months.  This has been progressive.  Her baseline level of activity @ this point is quite low.  I've discussed her case with Dr. Eden Emms and we will arrange for a lexiscan myoview prior to her surgery.  Cont asa, statin, bb.  2.  Chronic Diast CHF:  Grade 4 Diast Dysfxn on last echo with suggestion of restrictive pattern.  Her volume status has been stable @ home and she is euvolemic on exam today.  Hard to know how this is playing a role in her Ss of dyspnea.  We will repeat her echo with mitral and pulmonary inflows with and without valsalva to better define her diastolic dysfxn.  3.  Dyspnea:  See above.   Checking cbc/bmet today.  4.  Leg cramping:  She's on lasix and KCl.  Will check bmet today.  5.  DM:  Has been having issues with low sugars.  Mgmt per IM.  6.  GB dzs:  Pending lap chole.  Await myoview results for risk stratification.  Nicolasa Ducking, NP 04/02/2012, 12:25 PM

## 2012-04-02 NOTE — Patient Instructions (Addendum)
Your physician recommends that you schedule a follow-up appointment in: 4-6 weeks with Dr Eden Emms Your physician recommends that you have lab work drawn today (BMP, CBC)  Your physician has requested that you have an echocardiogram. Echocardiography is a painless test that uses sound waves to create images of your heart. It provides your doctor with information about the size and shape of your heart and how well your heart's chambers and valves are working. This procedure takes approximately one hour. There are no restrictions for this procedure.  Your physician has requested that you have a lexiscan myoview. For further information please visit [email or web address] Please follow instruction sheet, as given.

## 2012-04-05 ENCOUNTER — Ambulatory Visit (HOSPITAL_COMMUNITY): Payer: BC Managed Care – PPO | Attending: Nurse Practitioner | Admitting: Radiology

## 2012-04-05 DIAGNOSIS — Z951 Presence of aortocoronary bypass graft: Secondary | ICD-10-CM | POA: Insufficient documentation

## 2012-04-05 DIAGNOSIS — R0989 Other specified symptoms and signs involving the circulatory and respiratory systems: Secondary | ICD-10-CM | POA: Insufficient documentation

## 2012-04-05 DIAGNOSIS — R0602 Shortness of breath: Secondary | ICD-10-CM

## 2012-04-05 DIAGNOSIS — Z01810 Encounter for preprocedural cardiovascular examination: Secondary | ICD-10-CM | POA: Insufficient documentation

## 2012-04-05 DIAGNOSIS — R002 Palpitations: Secondary | ICD-10-CM | POA: Insufficient documentation

## 2012-04-05 DIAGNOSIS — I2510 Atherosclerotic heart disease of native coronary artery without angina pectoris: Secondary | ICD-10-CM

## 2012-04-05 DIAGNOSIS — I10 Essential (primary) hypertension: Secondary | ICD-10-CM | POA: Insufficient documentation

## 2012-04-05 DIAGNOSIS — Z9861 Coronary angioplasty status: Secondary | ICD-10-CM | POA: Insufficient documentation

## 2012-04-05 DIAGNOSIS — R0609 Other forms of dyspnea: Secondary | ICD-10-CM | POA: Insufficient documentation

## 2012-04-05 DIAGNOSIS — Z794 Long term (current) use of insulin: Secondary | ICD-10-CM | POA: Insufficient documentation

## 2012-04-05 DIAGNOSIS — E119 Type 2 diabetes mellitus without complications: Secondary | ICD-10-CM | POA: Insufficient documentation

## 2012-04-05 MED ORDER — TECHNETIUM TC 99M TETROFOSMIN IV KIT
10.0000 | PACK | Freq: Once | INTRAVENOUS | Status: AC | PRN
  Administered 2012-04-05: 10 via INTRAVENOUS

## 2012-04-05 MED ORDER — TECHNETIUM TC 99M TETROFOSMIN IV KIT
30.0000 | PACK | Freq: Once | INTRAVENOUS | Status: AC | PRN
  Administered 2012-04-05: 30 via INTRAVENOUS

## 2012-04-05 MED ORDER — REGADENOSON 0.4 MG/5ML IV SOLN
0.4000 mg | Freq: Once | INTRAVENOUS | Status: AC
  Administered 2012-04-05: 0.4 mg via INTRAVENOUS

## 2012-04-05 NOTE — Progress Notes (Signed)
[facility] SITE 3 NUCLEAR MED [address] [place] [address] [phone]  Cardiology Nuclear Med Study  Shelby Lyons is a 61 y.o. female     MRN : [id number]     DOB: 1950/12/16  Procedure Date: 04/05/2012  Nuclear Med Background Indication for Stress Test:  Evaluation for Ischemia, Graft Patency, Stent Patency, and Surgical Clearance for Cholecystectomy on 04/12/12 by Dr. Earney Navy Rosalita Levan, [place]) History: '00 MPS: EF: 46%, '94 MI-Angioplasty, LAD, '05 MI: LAT Heart Cath: Stent: LCFX and RCA, '06 Heart Cath and Stent RCA Restent RCA, 01/12 CABG x3 Heart Cath, 11/12 ECHO: EF: 55-60%, 04/02/12 EF: 45% severe hypokinesis mid/distal post wall  Cardiac Risk Factors: Hypertension and IDDM Type 2  Symptoms:  DOE, Palpitations and SOB   Nuclear Pre-Procedure Caffeine/Decaff Intake:  8:00pm NPO After: 8:00pm   Lungs:  clear O2 Sat: 97% on room air. IV 0.9% NS with Angio Cath:  22g  IV Site: R Antecubital x 1, tolerated well IV Started by:  Irean Hong, RN  Chest Size (in):  40 Cup Size: D  Height: 5\' 5"  (1.651 m)  Weight:  211 lb (95.709 kg)  BMI:  Body mass index is 35.11 kg/(m^2). Tech Comments: FBS was 200 at 8:00am. Patient took 20 units of lantus insulin at 8:00 this am. Patient took toprol this am.    Nuclear Med Study 1 or 2 day study: 1 day  Stress Test Type:  Lexiscan  Reading MD: Olga Millers, MD  Order Authorizing Provider:  Charlton Haws, MD, Ward Givens, NP  Resting Radionuclide: Technetium 69m Tetrofosmin  Resting Radionuclide Dose: 11.0 mCi   Stress Radionuclide:  Technetium 31m Tetrofosmin  Stress Radionuclide Dose: 33.0 mCi           Stress Protocol Rest HR: 71 Stress HR: 75  Rest BP: 115/64 Stress BP: 131/75  Exercise Time (min): n/a METS: n/a   Predicted Max HR: 159 bpm % Max HR: 47.17 bpm Rate Pressure Product: 9825   Dose of Adenosine (mg):  n/a Dose of Lexiscan: 0.4 mg  Dose of Atropine (mg): n/a Dose of Dobutamine: n/a  mcg/kg/min (at max HR)  Stress Test Technologist: Milana Na, EMT-P  Nuclear Technologist:  Doyne Keel, CNMT     Rest Procedure:  Myocardial perfusion imaging was performed at rest 45 minutes following the intravenous administration of Technetium 51m Tetrofosmin. Rest ECG: NSR - Normal EKG  Stress Procedure:  The patient received IV Lexiscan 0.4 mg over 15-seconds.  Technetium 29m Tetrofosmin injected at 30-seconds.  There were no significant changes and sob with Lexiscan.  Quantitative spect images were obtained after a 45 minute delay. Stress ECG: No significant change from baseline ECG  QPS Raw Data Images:  Acquisition technically good; severe LVE. Stress Images:  There is decreased uptake in the anteroseptal and inferolateral. Rest Images:  There is decreased uptake in the anteroseptal and inferolateral. Subtraction (SDS):  These findings are consistent with prior infarct. Transient Ischemic Dilatation (Normal <1.22):  1.05 Lung/Heart Ratio (Normal <0.45):  0.34  Quantitative Gated Spect Images QGS EDV:  160 ml QGS ESV:  113 ml  Impression Exercise Capacity:  Lexiscan with no exercise. BP Response:  Normal blood pressure response. Clinical Symptoms:  There is dyspnea. ECG Impression:  No significant ST segment change suggestive of ischemia. Comparison with Prior Nuclear Study: No images to compare  Overall Impression:  Abnormal stress nuclear study with large, severe, fixed anteroseptal and inferolateral defects consistent with previous infarct;  no ischemia.  LV Ejection Fraction: 29%.  LV Wall Motion:  Global hyokinesis; anteroseptal and inferolateral akinesis.  Olga Millers

## 2012-04-06 ENCOUNTER — Telehealth: Payer: Self-pay | Admitting: Cardiovascular Disease

## 2012-04-07 ENCOUNTER — Telehealth: Payer: Self-pay | Admitting: *Deleted

## 2012-04-07 ENCOUNTER — Encounter: Payer: Self-pay | Admitting: *Deleted

## 2012-04-07 NOTE — Telephone Encounter (Signed)
New problem:  Per Joy call the office , patient will be given a unit of blood for anemia. Also getting lasix. Please call back

## 2012-04-07 NOTE — Telephone Encounter (Signed)
PRE OP CLEARANCE NOTE FAXED ([phone]) TO DR Greig Castilla MOREHEAD PER PT'S REQUEST .Shelby Lyons

## 2012-04-08 NOTE — Telephone Encounter (Signed)
SPOKE WITH JOYCE   WILL HAVE BMET DONE TOM  LOCALLY AND WILL FAX COPY  TO OUR OFFICE  IN ORDER  TO ADJUST  LASIX  PT HAS HAD TO POST PONE SURGERY DUE TO ANEMIA  WILL GET COLONOSCOPY DONE  TO  FIND OUT WHY IS ANEMIC .Zack Seal

## 2012-04-09 ENCOUNTER — Telehealth: Payer: Self-pay | Admitting: *Deleted

## 2012-04-09 NOTE — Telephone Encounter (Signed)
Otelia Limes NP called about patient stated that patient hgb of 8 had a unit of blood ,repeat cbc and bmet  hgb now is now 9.2 and creatinine 1.10. Patient had gain from 209 to 223 lbs. Patient was taken Lasix 80 mg

## 2012-04-09 NOTE — Telephone Encounter (Signed)
Alona Bene states that after repeating pt's lab work ( HGB 9.2, creat. 1.10) she thinks pt is dry  , so she will start patient  on a 60 mg lasix instead of 80 mg  . Alona Bene states she know pt has diastolic heart failure. If Dr. Eden Emms does not agreed with the decreased dose, pt needs  to call to let her know.

## 2012-04-15 ENCOUNTER — Encounter: Payer: Self-pay | Admitting: Nurse Practitioner

## 2012-04-15 ENCOUNTER — Ambulatory Visit (INDEPENDENT_AMBULATORY_CARE_PROVIDER_SITE_OTHER): Payer: BC Managed Care – PPO | Admitting: Nurse Practitioner

## 2012-04-15 VITALS — BP 119/75 | HR 75 | Wt 231.0 lb

## 2012-04-15 DIAGNOSIS — I5042 Chronic combined systolic (congestive) and diastolic (congestive) heart failure: Secondary | ICD-10-CM | POA: Insufficient documentation

## 2012-04-15 DIAGNOSIS — I5032 Chronic diastolic (congestive) heart failure: Secondary | ICD-10-CM

## 2012-04-15 DIAGNOSIS — I509 Heart failure, unspecified: Secondary | ICD-10-CM

## 2012-04-15 DIAGNOSIS — I5043 Acute on chronic combined systolic (congestive) and diastolic (congestive) heart failure: Secondary | ICD-10-CM

## 2012-04-15 DIAGNOSIS — D509 Iron deficiency anemia, unspecified: Secondary | ICD-10-CM | POA: Insufficient documentation

## 2012-04-15 NOTE — Progress Notes (Addendum)
Patient Name: Shelby Lyons Date of Encounter: 04/15/2012  Primary Care Provider:  Feliciana Rossetti, MD, MD Primary Cardiologist:  P. Eden Emms, MD  Patient Profile  61 year old female with history of combined systolic and diastolic heart failure who presents secondary to a 22 pound weight gain.  Problem List   Past Medical History  Diagnosis Date  . CAD (coronary artery disease)     a. antlat MI 1994;  b. 9/05 Lat MI - DES LCX & RCA w/ subseq BMS to LCX 2/2 dissection;  c. 04/2005 DES to RCA;  d. 12/2010 CABG x 3: LIMA->LAD, VG->LCX, VG->PDA;  d. Myoview 03/2012 Antsept/inflat AK w/ fixed defects in those areas.  No ischemia.  EF 29% w/ global HK.  . Bradycardia     after cardioversion   . HTN (hypertension)   . Chronic combined systolic and diastolic CHF (congestive heart failure)     a. 10/2011 Echo:  EF 55-60% Antsept AK, Gr4 DD, irreversible restrictive pattern, Mod Dil LA;  03/2012 Echo: EF 45%, dist inf/infsept/ap HK, mod MR, mod dil LA, PASP  . DM2 (diabetes mellitus, type 2)   . Anxiety   . GERD (gastroesophageal reflux disease)   . Allergic rhinitis   . Obesity   . Abdominal pain, acute, right upper quadrant     a. GB dzs - pending cholecystectomy  . Microcytic anemia     a. s/p 1 u prbc's 04/2012   Past Surgical History  Procedure Date  . Ptca of the lad 1994  . Cardiac catheterization Jan 1998  . Coronary angiography Nov 2005    with PTCA placement of bare-metal stents x 2 in the mild and distal left circumflex artery-  . Appendectomy   . Hysterectomy (other) 1998  . Bilateral salpingoophorectomy 1998    Allergies  Allergies  Allergen Reactions  . Penicillins   . Sulfa Drugs Cross Reactors     HPI  61 year old female with the above complex problem list.  I last saw her in clinic at the end of last month at which time she was having dyspnea exertion and fatigue and we sent off a battery of blood work as well as obtained a 2-D echocardiogram and Myoview.   Her blood work was consistent with microcytic anemia which was not new while her echo showed an EF of 45% with multiple wall motion abnormalities.  This appeared to be changed from her echo in November of 2012 but was in line with the echocardiograms prior to that.  Her Myoview showed multiple areas of infarct without evidence of ischemia and she was felt to be at an acceptable cardiac risk for cholecystectomy.  It was noted on her bmet that her BUN was 37 with a creatinine of 1.6 and as a result her Lasix was placed on hold.  In the meantime, she was seen by primary care and because of her persistent microcytic anemia with a hemoglobin of 8.5 and hematocrit of 26.8, she was transfused one unit of packed red blood cells.  Patient noted that in the setting of transfusion also being off of Lasix, but she began to retain fluid rather rapidly and in the course of 5-7 days she had gained roughly 22 pounds.  At that point Lasix 30 mg b.i.d. Was resumed and then the patient and her son increase that to 40 mg b.i.d., which was her prior home dose.  Though she has not been putting on more fluid at 40 mg b.i.d., she also has  not been losing any and her weight remains in the 230 pound range whereas she was 209 pounds when she was last seen in this clinic.  Yesterday she was seen by her primary care provider and was given one dose of Zaroxolyn and this appointment was arranged.  Patient says that she did not notice any change in urine output with Zaroxolyn.  She continues to report dyspnea on exertion and significant flexion the edema and increasing abdominal girth.  She denies orthopnea and has not had chest pain.  Home Medications  Prior to Admission medications   Medication Sig Start Date End Date Taking? Authorizing Provider  albuterol (PROAIR HFA) 108 (90 BASE) MCG/ACT inhaler Inhale 2 puffs into the lungs every 6 (six) hours as needed.     Yes Historical Provider, MD  ALPRAZolam Prudy Feeler) 0.5 MG tablet Take 0.5 mg by  mouth daily as needed.     Yes Historical Provider, MD  atorvastatin (LIPITOR) 80 MG tablet Take 80 mg by mouth daily.     Yes Historical Provider, MD  fluticasone (FLONASE) 50 MCG/ACT nasal spray Place into the nose as needed.     Yes Historical Provider, MD  furosemide (LASIX) 40 MG tablet Take one (1) tablet(s) twice daily 04/17/11  Yes Gordy Savers, MD  glucose blood (ACCU-CHEK AVIVA PLUS) test strip 1 each by Other route 3 (three) times daily. Use as instructed 05/20/11  Yes Gordy Savers, MD  insulin aspart (NOVOLOG PENFILL) 100 UNIT/ML injection Inject into the skin 2 (two) times daily before a meal. 12-20 units    Yes Historical Provider, MD  insulin glargine (LANTUS) 100 UNIT/ML injection  01/25/11  Yes Gordy Savers, MD  metFORMIN (GLUCOPHAGE) 1000 MG tablet Take 1,000 mg by mouth 2 (two) times daily.     Yes Historical Provider, MD  metoprolol succinate (TOPROL-XL) 25 MG 24 hr tablet Take 1/2 tab   Yes Historical Provider, MD  nitroGLYCERIN (NITROSTAT) 0.4 MG SL tablet Place 0.4 mg under the tongue every 5 (five) minutes as needed.     Yes Historical Provider, MD  omeprazole (PRILOSEC) 40 MG capsule Take 1 tablet by mouth Daily. 04/07/12  Yes Historical Provider, MD  potassium chloride SA (K-DUR,KLOR-CON) 20 MEQ tablet Take 20 mEq by mouth daily.     Yes Historical Provider, MD  quinapril (ACCUPRIL) 10 MG tablet Take 10 mg by mouth at bedtime.   Yes Historical Provider, MD  sertraline (ZOLOFT) 100 MG tablet Take 100 mg by mouth daily.     Yes Historical Provider, MD    Family History  Family History  Problem Relation Age of Onset  . Diabetes Mother   . Hyperlipidemia Mother   . Heart attack Father   . Hypertension Sister   . Hypertension Sister     Social History  History   Social History  . Marital Status: Married    Spouse Name: N/A    Number of Children: N/A  . Years of Education: N/A   Occupational History  . Not on file.   Social History Main  Topics  . Smoking status: Never Smoker   . Smokeless tobacco: Not on file  . Alcohol Use: No  . Drug Use:   . Sexually Active:    Other Topics Concern  . Not on file   Social History Narrative   Lives in [place].      Review of Systems As above, the patient has significant lower extremity edema, increased abdominal girth, generalized fatigue,  dyspnea on exertion. All other systems reviewed and are otherwise negative except as noted above.  Physical Exam  Blood pressure 119/75, pulse 75, weight 231 lb (104.781 kg).  General: Pleasant, NAD Psych: Normal affect. Neuro: Alert and oriented X 3. Moves all extremities spontaneously. HEENT: Normal  Neck: Supple without bruits.  Neck veins are elevated to her jaw. Lungs:  Resp regular and unlabored, CTA. Heart: RRR no s3, s4, or murmurs. Abdomen: Soft, non-tender, non-distended, BS + x 4.  Extremities: No clubbing, cyanosis. She has 2+ bilateral lower extremity edema. DP/PT/Radials 2+ and equal bilaterally.  Accessory Clinical Findings  Patient's son reports that she had a creatinine drawn this past Tuesday and it was 1.1.  She was 1.3 last week.  She was 1.6 when I saw her in late April.  Assessment & Plan  1.  Acute on chronic combined systolic and diastolic congestive heart failure:  Upon our last visit, patient's weight was relatively stable however because of elevated creatinine her Lasix was placed on hold.  She appears to be very Lasix sensitive and also had a very narrow euvolemic window.  Her weight is up approximately 22 pounds despite resumption of Lasix at her usual home dose though she has not gone up in weight further since resuming Lasix.  We have a lot of catching up to do.  I have offered hospital admission for intravenous diuresis which will allow for closer monitoring of her renal status however patient would prefer to try and manage this as an outpatient first.  This being the case, I will increase her Lasix to 80 mg  b.i.d. Over the next 4 days and have provided her with a prescription to have a basic metabolic panel checked at her primary care provider on Monday.  I did this with the caveat that if she has not had significant up-tick in her urine output or reduction in volume/weight over the next 24 hours, then she needs to call us so that we can arrange for elective admission and diuresis.  Her heart rate and blood pressure are well controlled and we'll continue her current home medications.  2.  Anemia:  She has followup arranged with Dr. Cyndie Chime.  3. Dispo:  F/u Dr. Eden Emms or me in 1 week or sooner if necessary.  Nicolasa Ducking, NP 04/15/2012, 1:25 PM

## 2012-04-15 NOTE — Patient Instructions (Signed)
Increase Lasix to 80mg  twice daily for the next 4 days. Then, return to previous dose.  Increase Potassium to twice daily while on the increased dose of lasix.  Then, return to previous dose.  Have labs drawn on Monday at your primary care physician's office.  Your physician recommends that you schedule a follow-up appointment next Thursday or Friday with Tereso Newcomer at a time when Dr. Eden Emms is in the office.

## 2012-04-16 ENCOUNTER — Ambulatory Visit: Payer: BC Managed Care – PPO

## 2012-04-16 ENCOUNTER — Telehealth: Payer: Self-pay | Admitting: Oncology

## 2012-04-16 NOTE — Telephone Encounter (Signed)
Scheduled pt for 5/22 per Amy , talked to pt, she is aware of appt as a new patient, she is aware of location.

## 2012-04-20 ENCOUNTER — Telehealth: Payer: Self-pay | Admitting: Oncology

## 2012-04-20 NOTE — Telephone Encounter (Signed)
Referred by Dr. Feliciana Rossetti Dx-Anemia

## 2012-04-23 ENCOUNTER — Other Ambulatory Visit: Payer: Self-pay | Admitting: Oncology

## 2012-04-23 ENCOUNTER — Encounter: Payer: Self-pay | Admitting: Physician Assistant

## 2012-04-23 ENCOUNTER — Ambulatory Visit (INDEPENDENT_AMBULATORY_CARE_PROVIDER_SITE_OTHER): Payer: BC Managed Care – PPO | Admitting: Physician Assistant

## 2012-04-23 VITALS — BP 132/70 | HR 73 | Ht 65.0 in | Wt 229.0 lb

## 2012-04-23 DIAGNOSIS — D509 Iron deficiency anemia, unspecified: Secondary | ICD-10-CM

## 2012-04-23 DIAGNOSIS — D649 Anemia, unspecified: Secondary | ICD-10-CM

## 2012-04-23 DIAGNOSIS — I5043 Acute on chronic combined systolic (congestive) and diastolic (congestive) heart failure: Secondary | ICD-10-CM

## 2012-04-23 DIAGNOSIS — N189 Chronic kidney disease, unspecified: Secondary | ICD-10-CM

## 2012-04-23 DIAGNOSIS — R609 Edema, unspecified: Secondary | ICD-10-CM

## 2012-04-23 DIAGNOSIS — I2510 Atherosclerotic heart disease of native coronary artery without angina pectoris: Secondary | ICD-10-CM

## 2012-04-23 LAB — BASIC METABOLIC PANEL
BUN: 25 mg/dL — ABNORMAL HIGH (ref 6–23)
Calcium: 9 mg/dL (ref 8.4–10.5)
GFR: 44.6 mL/min — ABNORMAL LOW (ref >60.00)
Potassium: 4.4 mEq/L (ref 3.5–5.1)
Sodium: 138 mEq/L (ref 135–145)

## 2012-04-23 LAB — CBC WITH DIFFERENTIAL/PLATELET
Basophils Absolute: 0.1 10*3/uL (ref 0.0–0.1)
Eosinophils Absolute: 0.1 10*3/uL (ref 0.0–0.7)
Hemoglobin: 9.1 g/dL — ABNORMAL LOW (ref 12.0–15.0)
Lymphocytes Relative: 9 % — ABNORMAL LOW (ref 12.0–46.0)
MCHC: 31.4 g/dL (ref 30.0–36.0)
Monocytes Relative: 8 % (ref 3.0–12.0)
Neutro Abs: 4.4 10*3/uL (ref 1.4–7.7)
Neutrophils Relative %: 78.9 % — ABNORMAL HIGH (ref 43.0–77.0)
Platelets: 268 10*3/uL (ref 150.0–400.0)
RDW: 19.4 % — ABNORMAL HIGH (ref 11.5–14.6)

## 2012-04-23 LAB — TSH: TSH: 2.89 u[IU]/mL (ref 0.35–5.50)

## 2012-04-23 MED ORDER — FUROSEMIDE 80 MG PO TABS: 80.0000 mg | ORAL_TABLET | Freq: Two times a day (BID) | ORAL | Status: DC

## 2012-04-23 MED ORDER — POTASSIUM CHLORIDE CRYS ER 20 MEQ PO TBCR: 20.0000 meq | EXTENDED_RELEASE_TABLET | Freq: Two times a day (BID) | ORAL | Status: DC

## 2012-04-23 NOTE — Patient Instructions (Addendum)
Your physician has recommended you make the following change in your medication: INCREASE LASIX TO 80 MG TWICE DAILY; INCREASE POTASSIUM TO 20 MEQ TWICE DAILY, NEW PRESCRIPTIONS HAVE BEEN SENT IN TODAY FOR YOU  CBC W/DIFF, BMET, TSH TODAY  YOU HAVE BEEN GIVEN A PRESCRIPTION TO HAVE LAB WORK DONE 04/29/12 WITH YOUR PRIMARY CARE PHYSICIAN WITH RESULTS TO BE FAXED TO SCOTT WEAVER, PAC [phone]  YOU HAVE BEEN RECOMMENDED TO GET SOME THIGH HIGH COMPRESSION STOCKINGS AND HAVE BEEN GIVEN A FORM TO GET THESE.   PLEASE MAKE APPOINTMENT WITH SCOTT WEAVER, [facility] FOR 05/04/12 PER SCOTT WEAVER, PAC

## 2012-04-23 NOTE — Progress Notes (Signed)
[address] [place], [place]  [address] Phone: [phone] Fax:  [phone]  Date:  04/23/2012   Name:  Shelby Lyons   DOB:  [birth year]   MRN:  [id number]  PCP:  Feliciana Rossetti MD, MD  Primary Cardiologist:  Dr. Charlton Haws  Primary Electrophysiologist:  None    History of Present Illness: Shelby Lyons is a 61 y.o. female who returns for follow up on a/c combined systolic and diastolic CHF.  She has a history of CAD, status post anterolateral MI in 1994, lateral MI 9/05 treated with a DES to the CFX and RCA and subsequent BMS to the CFX in the setting of dissection, DES to the RCA in 04/2005 and subsequent CABG x3 in 12/2010, HTN, DM2, GERD, microcytic anemia.    She was evaluated by Nicolasa Ducking, NP 04/15/2012.  He had initially seen her at the end of April due to dyspnea with exertion and fatigue.  Myoview 4/13: AS and IL HK with fixed defects, no ischemia, EF 29%.  Echocardiogram 4/13: EF 45%, distal inferior/inferoseptal/apical HK, moderate MR, moderate LAE, PASP 63.  At that point, she was cleared for cholecystectomy with a low risk Myoview.  Lasix was held in the setting of renal insufficiency.  She was transfused with PRBCs x1 by her PCP secondary to microcytic anemia.    When seen by Mr. Brion Aliment, she had gained roughly 22 pounds over the course of 5-7 days.  She had already increased her Lasix without much benefit.  She was given a dose of metolazone by her PCP as well prior to her visit on 5/9.  In light of her 22 pound weight gain from volume overload, hospital admission was offered for IV Lasix.  Patient deferred this and wanted to manage her heart failure as an outpatient.  Lasix was increased to 80 mg twice a day for 4 days.  She was to get a follow up basic metabolic panel, however I do not see the results of this.  Of note, she is being referred to hematology for her anemia  Her weight decreased down to 223 pounds at home from 234 pounds while  taking Lasix 80 mg b.i.d.  She notes her lower extremity edema is basically unchanged.  Her weight has gone back up since coming off the Higher dose Lasix about 3 pounds.  She denies chest pain.  She has dyspnea with exertion.  This is fairly stable.  Overall, her dyspnea with exertion did improve with blood transfusion.  She states on 2 pillows chronically.  She denies PND.  She denies syncope.   Wt Readings from Last 3 Encounters:  04/23/12 229 lb (103.874 kg)  04/15/12 231 lb (104.781 kg)  04/05/12 211 lb (95.709 kg)     Potassium  Date/Time Value Range Status  04/02/2012 12:07 PM 4.8  3.5-5.1 (mEq/L) Final     Creatinine, Ser  Date/Time Value Range Status  04/02/2012 12:07 PM 1.6* 0.4-1.2 (mg/dL) Final   Lab Results  Component Value Date   WBC 7.6 04/02/2012   HGB 8.5 Repeated and verified X2.* 04/02/2012   HCT 26.8* 04/02/2012   MCV 74.2* 04/02/2012   PLT 298.0 04/02/2012      Past Medical History  Diagnosis Date  . CAD (coronary artery disease)     a. antlat MI 1994;  b. 9/05 Lat MI - DES LCX & RCA w/ subseq BMS to LCX 2/2 dissection;  c. 04/2005 DES to RCA;  d. 12/2010 CABG  x 3: LIMA->LAD, VG->LCX, VG->PDA;  d. Myoview 03/2012 Antsept/inflat AK w/ fixed defects in those areas.  No ischemia.  EF 29% w/ global HK.  . Bradycardia     after cardioversion   . HTN (hypertension)   . Chronic combined systolic and diastolic CHF (congestive heart failure)     a. 10/2011 Echo:  EF 55-60% Antsept AK, Gr4 DD, irreversible restrictive pattern, Mod Dil LA;  03/2012 Echo: EF 45%, dist inf/infsept/ap HK, mod MR, mod dil LA, PASP  . DM2 (diabetes mellitus, type 2)   . Anxiety   . GERD (gastroesophageal reflux disease)   . Allergic rhinitis   . Obesity   . Abdominal pain, acute, right upper quadrant     a. GB dzs - pending cholecystectomy  . Microcytic anemia     a. s/p 1 u prbc's 04/2012; b. Colonoscopy 5/13 "okay"    Current Outpatient Prescriptions  Medication Sig Dispense Refill    . albuterol (PROAIR HFA) 108 (90 BASE) MCG/ACT inhaler Inhale 2 puffs into the lungs every 6 (six) hours as needed.        . ALPRAZolam (XANAX) 0.5 MG tablet Take 0.5 mg by mouth daily as needed.        Marland Kitchen atorvastatin (LIPITOR) 80 MG tablet Take 80 mg by mouth daily.        . fluticasone (FLONASE) 50 MCG/ACT nasal spray Place into the nose as needed.        . furosemide (LASIX) 40 MG tablet Take one (1) tablet(s) twice daily  90 tablet  0  . glucose blood (ACCU-CHEK AVIVA PLUS) test strip 1 each by Other route 3 (three) times daily. Use as instructed  100 each  1  . insulin aspart (NOVOLOG PENFILL) 100 UNIT/ML injection Inject into the skin 2 (two) times daily before a meal. 12-20 units       . insulin glargine (LANTUS) 100 UNIT/ML injection       . metFORMIN (GLUCOPHAGE) 1000 MG tablet Take 1,000 mg by mouth 2 (two) times daily.        . metoprolol succinate (TOPROL-XL) 25 MG 24 hr tablet Take 1/2 tab      . nitroGLYCERIN (NITROSTAT) 0.4 MG SL tablet Place 0.4 mg under the tongue every 5 (five) minutes as needed.        Marland Kitchen omeprazole (PRILOSEC) 40 MG capsule Take 1 tablet by mouth Daily.      . potassium chloride SA (K-DUR,KLOR-CON) 20 MEQ tablet Take 20 mEq by mouth daily.        . quinapril (ACCUPRIL) 10 MG tablet Take 10 mg by mouth at bedtime.      . sertraline (ZOLOFT) 100 MG tablet Take 100 mg by mouth daily.          Allergies: Allergies  Allergen Reactions  . Penicillins   . Sulfa Drugs Cross Reactors     History  Substance Use Topics  . Smoking status: Never Smoker   . Smokeless tobacco: Not on file  . Alcohol Use: No     ROS:  Please see the history of present illness.     All other systems reviewed and negative.   PHYSICAL EXAM: VS:  BP 132/70  Pulse 73  Ht 5\' 5"  (1.651 m)  Wt 229 lb (103.874 kg)  BMI 38.11 kg/m2 Well nourished, well developed, in no acute distress HEENT: normal Neck: +JVD Cardiac:  normal S1, S2; RRR; no murmur, no S3 Lungs:  clear to  auscultation  bilaterally, no wheezing, rhonchi or rales Abd: soft, nontender, no hepatomegaly Ext: 2+ tight bilateral LE edema Skin: warm and dry Neuro:  CNs 2-12 intact, no focal abnormalities noted  EKG:  NSR, HR 73, LAD, iRBBB, PRWP, TW inversions 1, aVL, no change from prior   ASSESSMENT AND PLAN:  1.  A/C Combined Systolic and Diastolic CHF   -  Minimal improvement with increased Lasix   -  Long discussion with patient and her husband regarding further tx at home vs admission to the hospital for IV lasix.   -  Patient also seen by Dr. Charlton Haws today   -  After further review, we have opted to proceed with continued OP tx.    -  Increase Lasix to 80 mg BID and increase K+ to 20 mEq bid   -  Check BMET and repeat in one week.   -  Follow up in one week.  If no improvement, we have already informed the patient we would recommend admission at that time with plans for right heart cath.  2.  Edema   -  Suspect this is multifactorial and possibly exacerbated by her anemia.   -  Increase furosemide   -  Rx for compression stockings.   -  24 hour urine pendin   -  Check TSH today  3.  M/C Anemia   -  She sees heme next week  4.  CAD   -  Stable.  No angina.  Recent low risk myoview.  5.  Chronic Kidney Disease   -  Check BMET today and repeat in one week  6.  Cholelithiasis   -  Cholecystectomy currently on hold.    Luna Glasgow, PA-C  10:25 AM 04/23/2012

## 2012-04-28 ENCOUNTER — Ambulatory Visit (HOSPITAL_BASED_OUTPATIENT_CLINIC_OR_DEPARTMENT_OTHER): Payer: BC Managed Care – PPO | Admitting: Oncology

## 2012-04-28 ENCOUNTER — Other Ambulatory Visit (HOSPITAL_BASED_OUTPATIENT_CLINIC_OR_DEPARTMENT_OTHER): Payer: BC Managed Care – PPO | Admitting: Lab

## 2012-04-28 ENCOUNTER — Ambulatory Visit: Payer: BC Managed Care – PPO

## 2012-04-28 VITALS — BP 92/58 | HR 61 | Temp 97.7°F | Ht 65.0 in | Wt 228.2 lb

## 2012-04-28 DIAGNOSIS — N289 Disorder of kidney and ureter, unspecified: Secondary | ICD-10-CM

## 2012-04-28 DIAGNOSIS — D509 Iron deficiency anemia, unspecified: Secondary | ICD-10-CM

## 2012-04-28 DIAGNOSIS — D649 Anemia, unspecified: Secondary | ICD-10-CM

## 2012-04-28 LAB — IRON AND TIBC
Iron: <10 ug/dL — ABNORMAL LOW (ref 42–145)
UIBC: 378 ug/dL (ref 125–400)

## 2012-04-28 LAB — CBC & DIFF AND RETIC
Basophils Absolute: 0.1 10*3/uL (ref 0.0–0.1)
Eosinophils Absolute: 0.1 10*3/uL (ref 0.0–0.5)
HCT: 29.9 % — ABNORMAL LOW (ref 34.8–46.6)
HGB: 9 g/dL — ABNORMAL LOW (ref 11.6–15.9)
MCV: 73.8 fL — ABNORMAL LOW (ref 79.5–101.0)
MONO%: 8.9 % (ref 0.0–14.0)
NEUT#: 5.2 10*3/uL (ref 1.5–6.5)
NEUT%: 78.6 % — ABNORMAL HIGH (ref 38.4–76.8)
Platelets: 290 10*3/uL (ref 145–400)
RDW: 17.2 % — ABNORMAL HIGH (ref 11.2–14.5)
Retic Ct Abs: 79.79 10*3/uL (ref 33.70–90.70)

## 2012-04-28 LAB — MORPHOLOGY

## 2012-04-28 LAB — URINALYSIS, MICROSCOPIC - CHCC
Bilirubin (Urine): NEGATIVE
Leukocyte Esterase: NEGATIVE
Protein: NEGATIVE mg/dL
RBC count: NEGATIVE (ref 0–2)
pH: 7 (ref 4.6–8.0)

## 2012-04-28 LAB — FERRITIN: Ferritin: 22 ng/mL (ref 10–291)

## 2012-04-28 NOTE — Progress Notes (Signed)
New Patient Hematology-Oncology Evaluation   Shelby Lyons [id number] 05-02-1951 61 y.o. 04/28/2012  CC: Dr. Kerney Elbe; Dr. Beverely Low.; Dr. Charlton Haws   Reason for referral: Evaluation of multifactorial anemia   HPI:  Pleasant 61 year old woman with long-standing diabetes since 1978. She has been on insulin for over 20 years. She has hypertension, hyperlipidemia, and advanced coronary artery disease. She had an MI in 1994 and again in 2005. She has had multiple coronary stents placed, her son tells me 11 in total. She ultimately underwent three-vessel coronary bypass grafting in January of 2012 by Dr. Kathlee Nations Tright. She has reflux esophagitis but no history of ulcers. She uses low-dose aspirin on a daily basis but does not use any other antiplatelet agents. She does not drink alcohol. She has not had any change in her bowel habit and denies any hematochezia or melena. She had a colonoscopy by Dr. Earney Navy  2 weeks ago and it was reported to her as normal. She had upper endoscopy many years ago but not recently. No recent gastritis symptoms. She has chronic diastolic heart failure. Recently this has become harder to manage and she has had a 20 pound fluid gain. Diuretics were increased but then due to elevated BUN and creatinine were stopped then due to rapid  fluid reaccumulation, resumed again at current dose of 80 mg of Lasix twice daily. She has extremely poor dietary habits and eats primarily carbohydrates. She eats no red meat. She eats almost  no vegetables. Specifically she does not eat greens. She recently changed primary care physicians. She is now seeing Dr. Eveline Keto -- [facility]. She was found to have significant anemia with a hemoglobin of 8.5 g. MCV of 74 done on 04/02/2012.   I discussed her case with the nurse practitioner from Dr. Anders Grant office with respect to getting some preliminary labs and urine studies in anticipation of today's visit.  She was found  to have a low B12 level and started on a parenteral B12 injections. I asked her to obtain a serum protein electrophoresis and this was normal. No M. spike 24-hour urine collection total volume 1450 ML's at a time when serum creatinine was 1.3 showed a clearance of 42 mL per minute. No monoclonal protein seen on immunofixation electrophoresis. Serum erythropoietin level was 154 units.        PMH: Past Medical History  Diagnosis Date  . CAD (coronary artery disease)     a. antlat MI 1994;  b. 9/05 Lat MI - DES LCX & RCA w/ subseq BMS to LCX 2/2 dissection;  c. 04/2005 DES to RCA;  d. 12/2010 CABG x 3: LIMA->LAD, VG->LCX, VG->PDA;  d. Myoview 03/2012 Antsept/inflat AK w/ fixed defects in those areas.  No ischemia.  EF 29% w/ global HK.  . Bradycardia     after cardioversion   . HTN (hypertension)   . Chronic combined systolic and diastolic CHF (congestive heart failure)     a. 10/2011 Echo:  EF 55-60% Antsept AK, Gr4 DD, irreversible restrictive pattern, Mod Dil LA;  03/2012 Echo: EF 45%, dist inf/infsept/ap HK, mod MR, mod dil LA, PASP  . DM2 (diabetes mellitus, type 2)   . Anxiety   . GERD (gastroesophageal reflux disease)   . Allergic rhinitis   . Obesity   . Abdominal pain, acute, right upper quadrant     a. GB dzs - pending cholecystectomy  . Microcytic anemia     a. s/p 1 u prbc's 04/2012; b.  Colonoscopy 5/13 "okay"    Past Surgical History  Procedure Date  . Ptca of the lad 1994  . Cardiac catheterization Jan 1998  . Coronary angiography Nov 2005    with PTCA placement of bare-metal stents x 2 in the mild and distal left circumflex artery-  . Appendectomy  1995   . Hysterectomy (other) for bleeding. Cystic ovaries. Dr. Greta Doom 1998  . Bilateral salpingoophorectomy 1998  Bilateral cataract surgery. Sinus surgery. Surgery on a spine cyst. No history of hepatitis, yellow jaundice, thyroid disease, stroke, seizure, blood clots. No excessive bleeding after surgical  procedures.  Allergies: Allergies  Allergen Reactions  . Ativan (Lorazepam)   . Penicillins   . Sulfa Drugs Cross Reactors     Medications: Albuterol 2 puffs every 6 hours when necessary; Xanax 0.5 mg every 6 hours when necessary anxiety; Lipitor 80 mg daily; Zyrtec 10 mg daily; B12 1 mg IM monthly; Flonase 50 mcg per spray when necessary; Lasix 80 mg twice a day; NovoLog 100 units per mL 12-20 units twice a day based on sliding scale; Lantus insulin 30 units twice daily subcutaneous; Toprol-XL 25 mg one half tablet twice a day; Nitrostat 0.4 mg sublingual when necessary chest pain; Endocet 40 mg daily; Zofran 4 mg every 8 hours when necessary nausea; KCl 20 mEq twice a day; Accupril 10 mg at bedtime; Zoloft 100 mg daily; tramadol 50 mg every 6 hours when necessary pain.   Social History: Married. Son age 2 to his HEENT; daughter 69 with thyroid problems. She stopped smoking over 28 years ago. 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 No exposure to toxic chemicals.    Family History: Family History  Problem Relation Age of Onset  . Diabetes Mother   . Hyperlipidemia Mother   . Heart attack Father   . Hypertension Sister   . Hypertension Sister     Review of Systems: Constitutional symptoms: Chronically tired and weak HEENT: No sore throat Respiratory: Dyspnea on exertion Cardiovascular: Currently no chest pain  Gastrointestinal ROS: See above  Genito-Urinary ROS: No vaginal bleeding Hematological and Lymphatic: No swollen glands Musculoskeletal: No muscle or bone pain Neurologic: No headache or change in vision Dermatologic: No rash Remaining ROS negative.  Physical Exam: Blood pressure 92/58, pulse 61, temperature 97.7 F (36.5 C), temperature source Oral, height 5\' 5"  (1.651 m), weight 228 lb 3.2 oz (103.511 kg). Wt Readings from Last 3 Encounters:  04/28/12 228 lb 3.2 oz (103.511 kg)  04/23/12 229 lb (103.874 kg)  04/15/12 231 lb (104.781 kg)    General  appearance: Obese Caucasian woman Head: Normal Neck: Full range of motion Lymph nodes: No adenopathy Breasts: Not examined Lungs: Clear to auscultation resonant to percussion Heart: Regular rhythm no murmur or gallop Abdominal: Soft obese nontender no obvious mass or organomegaly GU: Not examined Extremities: 2-3+ edema in to the knee Neurologic: She is alert and oriented, cranial nerves grossly normal, fundi difficult to see optic disc sharp on the right not well visualized on the left Skin: No rash or ecchymosis    Lab Results: Lab Results  Component Value Date   WBC 6.6 04/28/2012   HGB 9.0* 04/28/2012   HCT 29.9* 04/28/2012   MCV 73.8* 04/28/2012   PLT 290 04/28/2012     Chemistry      Component Value Date/Time   NA 138 04/23/2012 1155   K 4.4 04/23/2012 1155   CL 101 04/23/2012 1155   CO2 28 04/23/2012 1155   BUN  25* 04/23/2012 1155   CREATININE 1.3* 04/23/2012 1155      Component Value Date/Time   CALCIUM 9.0 04/23/2012 1155   ALKPHOS 80 12/30/2010 0328   AST 15 12/30/2010 0328   ALT 10 12/30/2010 0328   BILITOT 0.4 12/30/2010 0328       Review of peripheral blood film:     Impression and Plan: Multifactorial anemia in a lady with multiple medical problems on multiple medications. Current anemia is microcytic. She was first noted to have a hemoglobin of 8 g following her cardio bypass surgery in 2012. I don't have any interim CBCs except for the recent one done in April. Hemoglobin 8.5 at that time. She does not remember ever having taken iron supplements. Iron studies have been done today through our lab and results are pending. She clearly has an element of anemia of chronic disease and an element of anemia due to chronic renal insufficiency with creatinine clearance of 42 mL per minute.  Screen for myeloma both serum and urine are negative.  Recommendation: I Am starting her on Niferex polysaccharide iron 150 mg twice daily. I don't expect to see a significant rise in  her hemoglobin for about 6 weeks. If she does not tolerate the oral iron then I will begin a trial of parenteral iron with ferra-heme 510 mg IV weekly x3 which we can get in my office. She was started on B12 replacement but B12 level not available at time of this dictation. Given her extremely poor dietary habits, this is quite reasonable. I would like to see what her response to iron and B12 is before we commit her to erythrocyte stimulating agents such as Epogen or Aranesp. New guidelines significantly restrict use of these agents unless hemoglobin is less than 10 g. I will see her back again in July to reassess the situation and make final recommendations.       Levert Feinstein, MD 04/28/2012, 5:39 PM

## 2012-04-29 ENCOUNTER — Telehealth: Payer: Self-pay | Admitting: Oncology

## 2012-04-29 ENCOUNTER — Encounter: Payer: Self-pay | Admitting: Oncology

## 2012-04-29 NOTE — Telephone Encounter (Signed)
Talked to pt and gave her appt for July 2013

## 2012-04-29 NOTE — Progress Notes (Signed)
New patient, patient has insurance, patient was not in need of financial assistance at this time did give patient my contact information, patient will contact advocate if anything changes. 

## 2012-04-29 NOTE — Progress Notes (Signed)
Addendum to new patient evaluation  Review of the peripheral blood film: Hypochromic microcytic red cells. 1+ spherocytes. 1+ polychromasia. 2+ aniso- poikilocytosis with many bizarre  shaped cells. Neutrophils and lymphocytes appear mature. Platelets normal.  Additional lab: Serum iron less than 10. Ferritin 22. LDH 219.  Findings are compatible with iron deficiency.

## 2012-04-30 ENCOUNTER — Other Ambulatory Visit: Payer: Self-pay | Admitting: Oncology

## 2012-04-30 ENCOUNTER — Encounter: Payer: Self-pay | Admitting: Physician Assistant

## 2012-04-30 DIAGNOSIS — D509 Iron deficiency anemia, unspecified: Secondary | ICD-10-CM

## 2012-05-04 ENCOUNTER — Telehealth: Payer: Self-pay | Admitting: *Deleted

## 2012-05-04 NOTE — Telephone Encounter (Signed)
pt notified of lab results and to stay on current therapy per Bing Neighbors. PAC, pt has appt to see Dr. Eden Emms 5/29 @ 10:30 pt aware of appt

## 2012-05-04 NOTE — Telephone Encounter (Signed)
Message copied by Tarri Fuller on Tue May 04, 2012  4:00 PM ------      Message from: [place], [place] T      Created: Tue May 04, 2012  1:51 PM       K 4.9      BUN 26      Creatinine 1.4            Stable      Continue current therapy.       Tereso Newcomer, PA-C  1:51 PM 05/04/2012

## 2012-05-05 ENCOUNTER — Encounter: Payer: Self-pay | Admitting: Cardiovascular Disease

## 2012-05-05 ENCOUNTER — Ambulatory Visit (INDEPENDENT_AMBULATORY_CARE_PROVIDER_SITE_OTHER): Payer: BC Managed Care – PPO | Admitting: Cardiovascular Disease

## 2012-05-05 VITALS — BP 133/78 | HR 78 | Ht 65.0 in | Wt 227.0 lb

## 2012-05-05 DIAGNOSIS — I10 Essential (primary) hypertension: Secondary | ICD-10-CM

## 2012-05-05 DIAGNOSIS — E119 Type 2 diabetes mellitus without complications: Secondary | ICD-10-CM

## 2012-05-05 DIAGNOSIS — R609 Edema, unspecified: Secondary | ICD-10-CM

## 2012-05-05 DIAGNOSIS — E785 Hyperlipidemia, unspecified: Secondary | ICD-10-CM

## 2012-05-05 DIAGNOSIS — Z79899 Other long term (current) drug therapy: Secondary | ICD-10-CM

## 2012-05-05 DIAGNOSIS — D509 Iron deficiency anemia, unspecified: Secondary | ICD-10-CM

## 2012-05-05 DIAGNOSIS — I2510 Atherosclerotic heart disease of native coronary artery without angina pectoris: Secondary | ICD-10-CM

## 2012-05-05 MED ORDER — METOLAZONE 2.5 MG PO TABS: 2.5000 mg | ORAL_TABLET | Freq: Every day | ORAL | Status: DC

## 2012-05-05 NOTE — Assessment & Plan Note (Signed)
Well controlled.  Continue current medications and low sodium Dash type diet.    

## 2012-05-05 NOTE — Assessment & Plan Note (Signed)
Multifactorial.  Add zaroxyln 2.5 M,W,F to Lasix 80 bid. F/U BMET in 8 weeks.  She has developed some pulmonary HTN as estimated by echo but not bad enough to warrant right heart cath at this time

## 2012-05-05 NOTE — Patient Instructions (Signed)
Your physician recommends that you schedule a follow-up appointment in: 8 WEEKS WITH DR [facility] Your physician recommends that you return for lab work in: BMET  IN 8 WEEKS Your physician has recommended you make the following change in your medication: ADD ZAROXOLYN 2.5 MG  1  Monday ,WEDNESDAY, AND Friday

## 2012-05-05 NOTE — Progress Notes (Signed)
Patient ID: Shelby Lyons, female   DOB: [birth year], 61 y.o.   MRN: [id number] Shelby Lyons is a 61 y.o. female who returns for follow up on a/c combined systolic and diastolic CHF.  She has a history of CAD, status post anterolateral MI in 1994, lateral MI 9/05 treated with a DES to the CFX and RCA and subsequent BMS to the CFX in the setting of dissection, DES to the RCA in 04/2005 and subsequent CABG x3 in 12/2010, HTN, DM2, GERD, microcytic anemia.  She was evaluated by Nicolasa Ducking, NP 04/15/2012. He had initially seen her at the end of April due to dyspnea with exertion and fatigue. Myoview 4/13: AS and IL HK with fixed defects, no ischemia, EF 29%. Echocardiogram 4/13: EF 45%, distal inferior/inferoseptal/apical HK, moderate MR, moderate LAE, PASP 63. At that point, she was cleared for cholecystectomy with a low risk Myoview. Lasix was held in the setting of renal insufficiency. She was transfused with PRBCs x1 by her PCP secondary to microcytic anemia.  When seen by Mr. Brion Aliment, she had gained roughly 22 pounds over the course of 5-7 days. She had already increased her Lasix without much benefit. She was given a dose of metolazone by her PCP as well prior to her visit on 5/9. In light of her 22 pound weight gain from volume overload, hospital admission was offered for IV Lasix. Patient deferred this and wanted to manage her heart failure as an outpatient.   Lasix increased to 80 bid Her weight decreased down to 223 pounds at home from 234 pounds while taking Lasix 80 mg b.i.d. She notes her lower extremity edema is basically unchanged. Her weight has gone back up since coming off the Higher dose Lasix about 3 pounds. She denies chest pain. She has dyspnea with exertion. This is fairly stable. Overall, her dyspnea with exertion did improve with blood transfusion. She states on 2 pillows chronically. She denies PND. She denies syncope.  Long discussion about healthy diet.  She eats almost all carbs.   She has not been compliant with her Iron.  Has not had any epogen yet.  She had a 24 hour urine protein At [facility] office  Echo 04/02/12 Study Conclusions  - Left ventricle: LVEF is approximately 45 % with hypokinesis of the distal inferior, inferoseptal and apical walls; severe hypokinesis of the mid/distal posterior wall The cavity size was normal. Wall thickness was normal. - Mitral valve: Moderate regurgitation. - Left atrium: The atrium was moderately dilated. - Right ventricle: The cavity size was mildly dilated. Systolic function was mildly reduced. - Right atrium: The atrium was mildly dilated. - Pulmonary arteries: PA peak pressure: 63mm Hg (S).      Wt Readings from Last 3 Encounters:  04/23/12  229 lb (103.874 kg)  04/15/12  231 lb (104.781 kg)  04/05/12  211 lb (95.709 kg)   Weight 227 today  ROS: Denies fever, malais, weight loss, blurry vision, decreased visual acuity, cough, sputum, SOB, hemoptysis, pleuritic pain, palpitaitons, heartburn, abdominal pain, melena, lower extremity edema, claudication, or rash.  All other systems reviewed and negative  General: Affect appropriate Chronically ill obese pale white female HEENT: normal Neck supple with no adenopathy JVP normal no bruits no thyromegaly Lungs clear with no wheezing and good diaphragmatic motion Heart:  S1/S2 no murmur, no rub, gallop or click PMI normal Abdomen: benighn, BS positve, no tenderness, no AAA no bruit.  No HSM or HJR Distal pulses intact with no bruits Plus  2 bilateral edema Neuro non-focal Skin warm and dry No muscular weakness   Current Outpatient Prescriptions  Medication Sig Dispense Refill  . albuterol (PROAIR HFA) 108 (90 BASE) MCG/ACT inhaler Inhale 2 puffs into the lungs every 6 (six) hours as needed.        . ALPRAZolam (XANAX) 0.5 MG tablet Take 0.5 mg by mouth daily as needed.        Marland Kitchen atorvastatin (LIPITOR) 80 MG tablet Take 80 mg by mouth daily.        .  cetirizine (ZYRTEC) 10 MG tablet Take 10 mg by mouth daily.      . cyanocobalamin (,VITAMIN B-12,) 1000 MCG/ML injection Inject 1,000 mcg into the muscle every 30 (thirty) days. Weekly x 3 wks then montly 04/28/12      . fluticasone (FLONASE) 50 MCG/ACT nasal spray Place into the nose as needed.        . furosemide (LASIX) 80 MG tablet Take 1 tablet (80 mg total) by mouth 2 (two) times daily.  60 tablet  3  . glucose blood (ACCU-CHEK AVIVA PLUS) test strip 1 each by Other route 3 (three) times daily. Use as instructed  100 each  1  . insulin aspart (NOVOLOG PENFILL) 100 UNIT/ML injection Inject into the skin 2 (two) times daily as needed. 12-20 units      . insulin glargine (LANTUS) 100 UNIT/ML injection Inject 30 Units into the skin 2 (two) times daily.       . metoprolol succinate (TOPROL-XL) 25 MG 24 hr tablet Take 12.5 mg by mouth 2 (two) times daily.       . nitroGLYCERIN (NITROSTAT) 0.4 MG SL tablet Place 0.4 mg under the tongue every 5 (five) minutes as needed.        Marland Kitchen omeprazole (PRILOSEC) 40 MG capsule Take 1 tablet by mouth Daily.      . ondansetron (ZOFRAN) 4 MG tablet Take 4 mg by mouth every 8 (eight) hours as needed.      . potassium chloride SA (K-DUR,KLOR-CON) 20 MEQ tablet Take 1 tablet (20 mEq total) by mouth 2 (two) times daily.  60 tablet  3  . quinapril (ACCUPRIL) 10 MG tablet Take 10 mg by mouth at bedtime.      . sertraline (ZOLOFT) 100 MG tablet Take 100 mg by mouth daily.        . traMADol (ULTRAM) 50 MG tablet Take 50 mg by mouth as needed.        Allergies  Ativan; Penicillins; and Sulfa drugs cross reactors  Electrocardiogram:  Assessment and Plan

## 2012-05-05 NOTE — Assessment & Plan Note (Signed)
Poorly controlled A1c rarely under 8  F/U Shary Decamp

## 2012-05-05 NOTE — Assessment & Plan Note (Signed)
Encouraged her to take iron and stool softener.  F/U [place] for Epogen

## 2012-05-05 NOTE — Assessment & Plan Note (Signed)
Stable with no angina and good activity level.  Continue medical Rx  

## 2012-05-05 NOTE — Assessment & Plan Note (Signed)
Cholesterol is at goal.  Continue current dose of statin and diet Rx.  No myalgias or side effects.  F/U  LFT's in 6 months. Lab Results  Component Value Date   LDLCALC  Value: 108        Total Cholesterol/HDL:CHD Risk Coronary Heart Disease Risk Table                     Men   Women  1/2 Average Risk   3.4   3.3  Average Risk       5.0   4.4  2 X Average Risk   9.6   7.1  3 X Average Risk  23.4   11.0        Use the calculated Patient Ratio above and the CHD Risk Table to determine the patient's CHD Risk.        ATP III CLASSIFICATION (LDL):  <100     mg/dL   Optimal  100-129  mg/dL   Near or Above                    Optimal  130-159  mg/dL   Borderline  160-189  mg/dL   High  >190     mg/dL   Very High* 12/27/2010             

## 2012-05-19 ENCOUNTER — Encounter: Payer: Self-pay | Admitting: Cardiovascular Disease

## 2012-05-20 ENCOUNTER — Telehealth: Payer: Self-pay | Admitting: Cardiovascular Disease

## 2012-05-20 NOTE — Telephone Encounter (Signed)
New problem:  Per Angie, recent labs on yesterday. Sodium level 123. Will be fax lab results over.

## 2012-05-21 NOTE — Telephone Encounter (Signed)
PER  DR NISHAN  DECREASE WATER INTAKE AND  RE CHECK BMET IN 8 WEEKS  UNABLE TO REACH ANGIE  WHOM LEFT ORIGINAL  MESSAGE ATTEMPTED TO CALL PT WITH INSTRUCTIONS  PHONE RANG SEVERAL TIMES WITH NO ANSWER AND NO VOICE MAIL .

## 2012-05-24 ENCOUNTER — Telehealth: Payer: Self-pay | Admitting: *Deleted

## 2012-05-24 NOTE — Telephone Encounter (Signed)
Spoke with ms ammens and advised to decrease water intake and then called angie at [facility] ([phone]) and advised pt needs repeat BMET in 8 weeks

## 2012-06-11 ENCOUNTER — Other Ambulatory Visit: Payer: Self-pay | Admitting: *Deleted

## 2012-06-11 DIAGNOSIS — D649 Anemia, unspecified: Secondary | ICD-10-CM

## 2012-06-11 MED ORDER — POLYSACCHARIDE IRON COMPLEX 150 MG PO CAPS: 150.0000 mg | ORAL_CAPSULE | Freq: Two times a day (BID) | ORAL | Status: DC

## 2012-06-14 ENCOUNTER — Other Ambulatory Visit (HOSPITAL_BASED_OUTPATIENT_CLINIC_OR_DEPARTMENT_OTHER): Payer: BC Managed Care – PPO

## 2012-06-14 ENCOUNTER — Telehealth: Payer: Self-pay | Admitting: Oncology

## 2012-06-14 ENCOUNTER — Ambulatory Visit (HOSPITAL_BASED_OUTPATIENT_CLINIC_OR_DEPARTMENT_OTHER): Payer: BC Managed Care – PPO | Admitting: Oncology

## 2012-06-14 ENCOUNTER — Other Ambulatory Visit: Payer: BC Managed Care – PPO | Admitting: Lab

## 2012-06-14 VITALS — BP 115/62 | HR 86 | Temp 97.4°F | Ht 65.0 in | Wt 196.8 lb

## 2012-06-14 DIAGNOSIS — N189 Chronic kidney disease, unspecified: Secondary | ICD-10-CM

## 2012-06-14 DIAGNOSIS — N039 Chronic nephritic syndrome with unspecified morphologic changes: Secondary | ICD-10-CM

## 2012-06-14 DIAGNOSIS — D509 Iron deficiency anemia, unspecified: Secondary | ICD-10-CM

## 2012-06-14 DIAGNOSIS — E538 Deficiency of other specified B group vitamins: Secondary | ICD-10-CM

## 2012-06-14 LAB — CBC & DIFF AND RETIC
Eosinophils Absolute: 0.5 10*3/uL (ref 0.0–0.5)
HGB: 10.2 g/dL — ABNORMAL LOW (ref 11.6–15.9)
Immature Retic Fract: 9.7 % (ref 1.60–10.00)
MONO#: 0.6 10*3/uL (ref 0.1–0.9)
MONO%: 6.9 % (ref 0.0–14.0)
NEUT#: 6.5 10*3/uL (ref 1.5–6.5)
RBC: 4.54 10*6/uL (ref 3.70–5.45)
RDW: 19.9 % — ABNORMAL HIGH (ref 11.2–14.5)
Retic %: 1.19 % (ref 0.70–2.10)
Retic Ct Abs: 54.03 10*3/uL (ref 33.70–90.70)
WBC: 8.2 10*3/uL (ref 3.9–10.3)
lymph#: 0.7 10*3/uL — ABNORMAL LOW (ref 0.9–3.3)

## 2012-06-14 LAB — IRON AND TIBC
%SAT: 10 % — ABNORMAL LOW (ref 20–55)
TIBC: 354 ug/dL (ref 250–470)

## 2012-06-14 NOTE — Telephone Encounter (Signed)
appt made and printed for pt aom °

## 2012-06-14 NOTE — Progress Notes (Signed)
Hematology and Oncology Follow Up Visit  Shelby Lyons [id number] 04/16/1951 61 y.o. 06/14/2012 8:27 PM   Principle Diagnosis: Encounter Diagnosis  Name Primary?  . Microcytic hypochromic anemia Yes     Interim History:  Short interval followup visit for this 61 year old woman I evaluated here back in May for what appears to be a multifactorial anemia. She is a long-standing insulin-dependent diabetic she has hypertension, hyperlipidemia, advanced coronary artery disease. She has poor dietary habits. There is a history of GERD but no ulcers. She had no upper GI symptoms. Colonoscopy done this year was unremarkable. Please see my may 22nd 2013 note for complete details of her past history. She had hemoglobins as low as 8.5 g with MCV 74 done 04/02/2012. She was found to have a low B12 level. She was started on parenteral B12 injections. Myeloma screen done through this office was unremarkable for any monoclonal proteins in the serum or urine. Creatinine clearance significantly decreased at 42 mL per minute with serum creatinine of 1.3. Hemoglobin was 9 with MCV 74 at time of her visit here in May. Serum iron was less than 10 and ferritin was 22. She was not on an iron supplement. I started her on Niferex 150 mg twice daily. Unfortunately this is causing severe constipation so bad that she almost had to go to the emergency room to get disimpacted. However she did get some results on the iron that she did take with a modest rise in her hemoglobin from 9- to 10.2 g today and improvement in her iron profile is with serum iron up to 34 and ferritin 42.  Serum erythropoietin level was elevated at 152 which is consistent with an appropriate response to iron deficiency.  Medications: reviewed  Allergies:  Allergies  Allergen Reactions  . Ativan (Lorazepam)   . Penicillins   . Sulfa Drugs Cross Reactors     Review of Systems: See above Remaining ROS negative.  Physical Exam: Blood pressure  115/62, pulse 86, temperature 97.4 F (36.3 C), height 5\' 5"  (1.651 m), weight 196 lb 12.8 oz (89.268 kg). Wt Readings from Last 3 Encounters:  06/14/12 196 lb 12.8 oz (89.268 kg)  05/05/12 227 lb (102.967 kg)  04/28/12 228 lb 3.2 oz (103.511 kg)     General appearance: Pleasant overweight Caucasian woman HENNT: Pharynx no erythema or exudate Lymph nodes: No adenopathy Breasts: Not examined Lungs: Clear to auscultation resonant to percussion Heart: Regular rhythm no murmur Abdomen: Soft nontender no mass no organomegaly Extremities: Chronic brawny edema and venous stasis changes Vascular: No cyanosis Neurologic: Grossly normal complete exam not done today Skin: Pale no rash or ecchymosis  Lab Results: Lab Results  Component Value Date   WBC 8.2 06/14/2012   HGB 10.2* 06/14/2012   HCT 32.7* 06/14/2012   MCV 72.0* 06/14/2012   PLT 274 06/14/2012     Chemistry      Component Value Date/Time   NA 138 04/23/2012 1155   K 4.4 04/23/2012 1155   CL 101 04/23/2012 1155   CO2 28 04/23/2012 1155   BUN 25* 04/23/2012 1155   CREATININE 1.3* 04/23/2012 1155      Component Value Date/Time   CALCIUM 9.0 04/23/2012 1155   ALKPHOS 80 12/30/2010 0328   AST 15 12/30/2010 0328   ALT 10 12/30/2010 0328   BILITOT 0.4 12/30/2010 0328       Radiological Studies: No results found.  Impression and Plan: Multifactorial anemia primarily iron deficiency with components of B12 deficiency  and anemia of chronic renal failure with creatinine clearance of 42 mL per minute. Although she is clearly responding to oral iron, she is not tolerating the iron due to severe constipation. Plan: Am going to get her back for a series of parenteral iron infusions. We discussed this possibility today along with her husband. I reviewed potential for IV iron to cause allergic reactions which rarely can be severe (anaphylaxis). I will plan on giving HER-2 weekly doses of very heme 510 mg each over 20 minutes   CC:. Dr. Feliciana Rossetti; Dr. Charlton Haws; Dr. Maryruth Bun --Helayne Seminole, MD 7/8/20138:27 PM

## 2012-06-17 ENCOUNTER — Other Ambulatory Visit: Payer: Self-pay | Admitting: Oncology

## 2012-06-17 DIAGNOSIS — D509 Iron deficiency anemia, unspecified: Secondary | ICD-10-CM

## 2012-06-18 ENCOUNTER — Telehealth: Payer: Self-pay | Admitting: Oncology

## 2012-06-18 NOTE — Telephone Encounter (Signed)
lmonvm for pt confirming 7/15 appt.

## 2012-06-21 ENCOUNTER — Ambulatory Visit: Payer: BC Managed Care – PPO | Admitting: Nutrition

## 2012-06-21 ENCOUNTER — Ambulatory Visit (HOSPITAL_BASED_OUTPATIENT_CLINIC_OR_DEPARTMENT_OTHER): Payer: BC Managed Care – PPO

## 2012-06-21 VITALS — BP 124/69 | HR 76 | Temp 98.2°F

## 2012-06-21 DIAGNOSIS — D509 Iron deficiency anemia, unspecified: Secondary | ICD-10-CM

## 2012-06-21 MED ORDER — SODIUM CHLORIDE 0.9 % IV SOLN
Freq: Once | INTRAVENOUS | Status: AC
  Administered 2012-06-21: 15:00:00 via INTRAVENOUS

## 2012-06-21 MED ORDER — SODIUM CHLORIDE 0.9 % IV SOLN
1020.0000 mg | Freq: Once | INTRAVENOUS | Status: AC
  Administered 2012-06-21: 1020 mg via INTRAVENOUS
  Filled 2012-06-21: qty 34

## 2012-06-21 NOTE — Patient Instructions (Addendum)
Ferumoxytol injection What is this medicine? FERUMOXYTOL is an iron complex. Iron is used to make healthy red blood cells, which carry oxygen and nutrients throughout the body. This medicine is used to treat iron deficiency anemia in people with chronic kidney disease. This medicine may be used for other purposes; ask your health care provider or pharmacist if you have questions. What should I tell my health care provider before I take this medicine? They need to know if you have any of these conditions: -anemia not caused by low iron levels -high levels of iron in the blood -magnetic resonance imaging (MRI) test scheduled -an unusual or allergic reaction to iron, other medicines, foods, dyes, or preservatives -pregnant or trying to get pregnant -breast-feeding How should I use this medicine? This medicine is for infusion into a vein. It is given by a health care professional in a hospital or clinic setting. Talk to your pediatrician regarding the use of this medicine in children. Special care may be needed. Overdosage: If you think you've taken too much of this medicine contact a poison control center or emergency room at once. Overdosage: If you think you have taken too much of this medicine contact a poison control center or emergency room at once. NOTE: This medicine is only for you. Do not share this medicine with others. What if I miss a dose? It is important not to miss your dose. Call your doctor or health care professional if you are unable to keep an appointment. What may interact with this medicine? This medicine may interact with the following medications: -other iron products This list may not describe all possible interactions. Give your health care provider a list of all the medicines, herbs, non-prescription drugs, or dietary supplements you use. Also tell them if you smoke, drink alcohol, or use illegal drugs. Some items may interact with your medicine. What should I watch  for while using this medicine? Visit your doctor or healthcare professional regularly. Tell your doctor or healthcare professional if your symptoms do not start to get better or if they get worse. You may need blood work done while you are taking this medicine. You may need to follow a special diet. Talk to your doctor. Foods that contain iron include: whole grains/cereals, dried fruits, beans, or peas, leafy green vegetables, and organ meats (liver, kidney). What side effects may I notice from receiving this medicine? Side effects that you should report to your doctor or health care professional as soon as possible: -allergic reactions like skin rash, itching or hives, swelling of the face, lips, or tongue -breathing problems -changes in blood pressure -feeling faint or lightheaded, falls -fever or chills -flushing, sweating, or hot feelings -swelling of the ankles or feet Side effects that usually do not require medical attention (Report these to your doctor or health care professional if they continue or are bothersome.): -diarrhea -headache -nausea, vomiting -stomach pain This list may not describe all possible side effects. Call your doctor for medical advice about side effects. You may report side effects to FDA at 1-800-FDA-1088. Where should I keep my medicine? This drug is given in a hospital or clinic and will not be stored at home. NOTE: This sheet is a summary. It may not cover all possible information. If you have questions about this medicine, talk to your doctor, pharmacist, or health care provider.  2012, Elsevier/Gold Standard. (08/16/2008 9:48:25 PM) 

## 2012-06-21 NOTE — Assessment & Plan Note (Signed)
The patient is a 61 year old female patient of Dr. Cyndie Chime diagnosed with multifactorial anemia.  MEDICAL HISTORY INCLUDES:  Insulin-dependent diabetes, hypertension, hyperlipidemia, advanced CAD, anxiety, GERD, and obesity.  MEDICATIONS INCLUDE:  Xanax, Lipitor, Dulcolax, Lasix, NovoLog, Lantus, Prilosec, Zofran, K-Dur, Requip, and Zoloft.  LABS:  Reviewed.  HEIGHT:  65 inches. WEIGHT:  196.8 pounds. USUAL BODY WEIGHT:  227 pounds, May 29th. BMI:  32.75.  The patient reports weight loss is primarily fluid shift.  She complains of constipation from oral iron supplements.  She states she does not eat a lot of meat or other protein foods but knows she needs to.  She is interested in information on foods that have more protein.  NUTRITION DIAGNOSIS:  Food and nutrition-related knowledge deficit related to diagnosis of anemia and associated treatments as evidenced by no prior need for nutrition-related information.  INTERVENTION:  I have educated the patient on trying to incorporate foods with more iron in them and how to increase iron absorption.  I have also educated her on protein sources within her diet and given her some suggestions on how she can add protein to meals and snacks that she is currently eating.  I provided fact sheets for her to refer to and my contact information for questions or concerns.  MONITORING/EVALUATION (GOALS):  The patient will increase protein sources to promote preservation of lean body mass.  NEXT VISIT:  There is no followup scheduled.  The patient will contact me with questions or concerns.    ______________________________ Shelby Lyons, RD,CSO,LDN Clinical Nutrition Specialist BN/MEDQ  D:  06/21/2012  T:  06/21/2012  Job:  [phone]

## 2012-06-25 ENCOUNTER — Other Ambulatory Visit: Payer: Self-pay | Admitting: *Deleted

## 2012-06-28 ENCOUNTER — Telehealth: Payer: Self-pay | Admitting: *Deleted

## 2012-06-28 ENCOUNTER — Ambulatory Visit: Payer: BC Managed Care – PPO

## 2012-06-28 NOTE — Telephone Encounter (Signed)
Patient called to reschedule her appt from today to another day. Patient has a funeral today. Appt moved to Friday, patient given new apt date/time. Desk RN notified.  JMW

## 2012-06-29 ENCOUNTER — Telehealth: Payer: Self-pay | Admitting: *Deleted

## 2012-06-29 NOTE — Telephone Encounter (Signed)
Called patient to let her know that Dr. Cyndie Chime does not think she needs a second dose of fereheme.  So she does not need to come this Friday.  Reviewed her future appts. With her.  She did not tolerate oral iron so she does not need to try and take it.

## 2012-07-02 ENCOUNTER — Ambulatory Visit: Payer: BC Managed Care – PPO

## 2012-07-06 ENCOUNTER — Other Ambulatory Visit (INDEPENDENT_AMBULATORY_CARE_PROVIDER_SITE_OTHER): Payer: BC Managed Care – PPO

## 2012-07-06 ENCOUNTER — Encounter: Payer: Self-pay | Admitting: Cardiovascular Disease

## 2012-07-06 ENCOUNTER — Ambulatory Visit (INDEPENDENT_AMBULATORY_CARE_PROVIDER_SITE_OTHER): Payer: BC Managed Care – PPO | Admitting: Cardiovascular Disease

## 2012-07-06 VITALS — BP 108/56 | HR 70 | Ht 65.0 in | Wt 195.0 lb

## 2012-07-06 DIAGNOSIS — E785 Hyperlipidemia, unspecified: Secondary | ICD-10-CM

## 2012-07-06 DIAGNOSIS — I2510 Atherosclerotic heart disease of native coronary artery without angina pectoris: Secondary | ICD-10-CM

## 2012-07-06 DIAGNOSIS — E119 Type 2 diabetes mellitus without complications: Secondary | ICD-10-CM

## 2012-07-06 DIAGNOSIS — I10 Essential (primary) hypertension: Secondary | ICD-10-CM

## 2012-07-06 DIAGNOSIS — Z79899 Other long term (current) drug therapy: Secondary | ICD-10-CM

## 2012-07-06 DIAGNOSIS — D509 Iron deficiency anemia, unspecified: Secondary | ICD-10-CM

## 2012-07-06 LAB — BASIC METABOLIC PANEL
CO2: 30 mEq/L (ref 19–32)
Calcium: 9.4 mg/dL (ref 8.4–10.5)
Creatinine, Ser: 1.2 mg/dL (ref 0.4–1.2)
GFR: 46.65 mL/min — ABNORMAL LOW (ref >60.00)

## 2012-07-06 MED ORDER — NITROGLYCERIN 0.4 MG SL SUBL: 0.4000 mg | SUBLINGUAL_TABLET | SUBLINGUAL | Status: AC | PRN

## 2012-07-06 NOTE — Assessment & Plan Note (Signed)
Iron injection next month.  F/U Granfortuna and suggest possible epogen Rx

## 2012-07-06 NOTE — Assessment & Plan Note (Signed)
Well controlled.  Continue current medications and low sodium Dash type diet.   Check BMET today with diuretic dose

## 2012-07-06 NOTE — Patient Instructions (Signed)
Your physician wants you to follow-up in:  6 MONTHS WITH DR NISHAN  You will receive a reminder letter in the mail two months in advance. If you don't receive a letter, please call our office to schedule the follow-up appointment. Your physician recommends that you continue on your current medications as directed. Please refer to the Current Medication list given to you today. 

## 2012-07-06 NOTE — Progress Notes (Signed)
Patient ID: Shelby Lyons, female   DOB: [birth year], 61 y.o.   MRN: [id number] Shelby Lyons is a 61 y.o. female who returns for follow up on a/c combined systolic and diastolic CHF.  She has a history of CAD, status post anterolateral MI in 1994, lateral MI 9/05 treated with a DES to the CFX and RCA and subsequent BMS to the CFX in the setting of dissection, DES to the RCA in 04/2005 and subsequent CABG x3 in 12/2010, HTN, DM2, GERD, microcytic anemia.  She was evaluated by Nicolasa Ducking, NP 04/15/2012. He had initially seen her at the end of April due to dyspnea with exertion and fatigue. Myoview 4/13: AS and IL HK with fixed defects, no ischemia, EF 29%. Echocardiogram 4/13: EF 45%, distal inferior/inferoseptal/apical HK, moderate MR, moderate LAE, PASP 63. At that point, she was cleared for cholecystectomy with a low risk Myoview. Lasix was held in the setting of renal insufficiency. She was transfused with PRBCs x1 by her PCP secondary to microcytic anemia.  When seen by Mr. Brion Aliment, she had gained roughly 22 pounds over the course of 5-7 days. She had already increased her Lasix without much benefit. She was given a dose of metolazone by her PCP as well prior to her visit on 5/9. In light of her 22 pound weight gain from volume overload, hospital admission was offered for IV Lasix. Patient deferred this and wanted to manage her heart failure as an outpatient. Lasix increased to 80 bid Her weight decreased down to 223 pounds at home from 234 pounds while taking Lasix 80 mg b.i.d. She notes her lower extremity edema is basically unchanged. Her weight has gone back up since coming off the Higher dose Lasix about 3 pounds. She denies chest pain. She has dyspnea with exertion. This is fairly stable. Overall, her dyspnea with exertion did improve with blood transfusion. She states on 2 pillows chronically. She denies PND. She denies syncope.  Long discussion about healthy diet. She eats almost all carbs. Saw  Granfortuna for anemia recently.  No myeloma and she was noncompliant with Niferex due to constipation.  Started on parental iron last month.  Echo 04/02/12  Study Conclusions  - Left ventricle: LVEF is approximately 45 % with hypokinesis of the distal inferior, inferoseptal and apical walls; severe hypokinesis of the mid/distal posterior wall The cavity size was normal. Wall thickness was normal. - Mitral valve: Moderate regurgitation. - Left atrium: The atrium was moderately dilated. - Right ventricle: The cavity size was mildly dilated. Systolic function was mildly reduced. - Right atrium: The atrium was mildly dilated. - Pulmonary arteries: PA peak pressure: 63mm Hg (S).  ROS: Denies fever, malais, weight loss, blurry vision, decreased visual acuity, cough, sputum, SOB, hemoptysis, pleuritic pain, palpitaitons, heartburn, abdominal pain, melena, lower extremity edema, claudication, or rash.  All other systems reviewed and negative  General: Affect appropriate Chronically ill obese white female HEENT: normal Neck supple with no adenopathy JVP normal no bruits no thyromegaly Lungs clear with no wheezing and good diaphragmatic motion Heart:  S1/S2 no murmur, no rub, gallop or click PMI normal Abdomen: benighn, BS positve, no tenderness, no AAA no bruit.  No HSM or HJR Distal pulses intact with no bruits No edema Neuro non-focal Skin warm and dry No muscular weakness   Current Outpatient Prescriptions  Medication Sig Dispense Refill  . albuterol (PROAIR HFA) 108 (90 BASE) MCG/ACT inhaler Inhale 2 puffs into the lungs every 6 (six) hours as needed.        Marland Kitchen  ALPRAZolam (XANAX) 0.5 MG tablet Take 0.5 mg by mouth daily as needed.        Marland Kitchen atorvastatin (LIPITOR) 80 MG tablet Take 80 mg by mouth daily.        . bisacodyl (DULCOLAX) 5 MG EC tablet Take 5 mg by mouth daily as needed.      . cetirizine (ZYRTEC) 10 MG tablet Take 10 mg by mouth daily.      . cyanocobalamin (,VITAMIN  B-12,) 1000 MCG/ML injection Inject 1,000 mcg into the muscle every 14 (fourteen) days. Weekly x 3 wks then montly 04/28/12      . fluticasone (FLONASE) 50 MCG/ACT nasal spray Place into the nose as needed.        . furosemide (LASIX) 80 MG tablet Take 1 tablet (80 mg total) by mouth 2 (two) times daily.  60 tablet  3  . glucose blood (ACCU-CHEK AVIVA PLUS) test strip 1 each by Other route 3 (three) times daily. Use as instructed  100 each  1  . insulin aspart (NOVOLOG PENFILL) 100 UNIT/ML injection Inject into the skin 2 (two) times daily as needed. 12-20 units      . insulin glargine (LANTUS) 100 UNIT/ML injection Inject 30 Units into the skin 2 (two) times daily.       . metolazone (ZAROXOLYN) 2.5 MG tablet 1 tablet on Mon, Wed, Fri       . metoprolol succinate (TOPROL-XL) 25 MG 24 hr tablet Take 12.5 mg by mouth 2 (two) times daily.       . nitroGLYCERIN (NITROSTAT) 0.4 MG SL tablet Place 0.4 mg under the tongue every 5 (five) minutes as needed.        Marland Kitchen omeprazole (PRILOSEC) 40 MG capsule Take 1 tablet by mouth Daily.      . ondansetron (ZOFRAN) 4 MG tablet Take 4 mg by mouth every 8 (eight) hours as needed.      . potassium chloride SA (K-DUR,KLOR-CON) 20 MEQ tablet Take 1 tablet (20 mEq total) by mouth 2 (two) times daily.  60 tablet  3  . quinapril (ACCUPRIL) 10 MG tablet Take 10 mg by mouth at bedtime.      Marland Kitchen rOPINIRole (REQUIP) 0.5 MG tablet Take 0.5 mg by mouth Daily.      . sertraline (ZOLOFT) 100 MG tablet Take 100 mg by mouth daily.        . traMADol (ULTRAM) 50 MG tablet Take 50 mg by mouth as needed.      . iron polysaccharides (NIFEREX) 150 MG capsule Take 1 capsule (150 mg total) by mouth 2 (two) times daily.  60 capsule      Allergies  Ativan; Penicillins; and Sulfa drugs cross reactors  Electrocardiogram:  04/23/12  NSR rate 73  LAD ICRBBB poor R wave progression  Assessment and Plan

## 2012-07-06 NOTE — Assessment & Plan Note (Signed)
Cholesterol is at goal.  Continue current dose of statin and diet Rx.  No myalgias or side effects.  F/U  LFT's in 6 months. Lab Results  Component Value Date   LDLCALC  Value: 108        Total Cholesterol/HDL:CHD Risk Coronary Heart Disease Risk Table                     Men   Women  1/2 Average Risk   3.4   3.3  Average Risk       5.0   4.4  2 X Average Risk   9.6   7.1  3 X Average Risk  23.4   11.0        Use the calculated Patient Ratio above and the CHD Risk Table to determine the patient's CHD Risk.        ATP III CLASSIFICATION (LDL):  <100     mg/dL   Optimal  100-129  mg/dL   Near or Above                    Optimal  130-159  mg/dL   Borderline  160-189  mg/dL   High  >190     mg/dL   Very High* 12/27/2010             

## 2012-07-06 NOTE — Assessment & Plan Note (Signed)
Target A1c for her never been better than 7 range.  Counseled on low carb diet.  Modest comopliance

## 2012-07-06 NOTE — Assessment & Plan Note (Signed)
Stable with no angina and good activity level.  Continue medical Rx  

## 2012-08-30 ENCOUNTER — Ambulatory Visit (HOSPITAL_BASED_OUTPATIENT_CLINIC_OR_DEPARTMENT_OTHER): Payer: BC Managed Care – PPO | Admitting: Nurse Practitioner

## 2012-08-30 ENCOUNTER — Telehealth: Payer: Self-pay | Admitting: Oncology

## 2012-08-30 ENCOUNTER — Other Ambulatory Visit (HOSPITAL_BASED_OUTPATIENT_CLINIC_OR_DEPARTMENT_OTHER): Payer: BC Managed Care – PPO | Admitting: Lab

## 2012-08-30 VITALS — BP 124/67 | HR 69 | Temp 97.0°F | Resp 20 | Ht 65.0 in | Wt 205.6 lb

## 2012-08-30 DIAGNOSIS — N039 Chronic nephritic syndrome with unspecified morphologic changes: Secondary | ICD-10-CM

## 2012-08-30 DIAGNOSIS — D631 Anemia in chronic kidney disease: Secondary | ICD-10-CM

## 2012-08-30 DIAGNOSIS — D649 Anemia, unspecified: Secondary | ICD-10-CM

## 2012-08-30 DIAGNOSIS — D509 Iron deficiency anemia, unspecified: Secondary | ICD-10-CM

## 2012-08-30 DIAGNOSIS — E538 Deficiency of other specified B group vitamins: Secondary | ICD-10-CM

## 2012-08-30 LAB — IRON AND TIBC
Iron: 79 ug/dL (ref 42–145)
TIBC: 279 ug/dL (ref 250–470)
UIBC: 200 ug/dL (ref 125–400)

## 2012-08-30 LAB — CBC WITH DIFFERENTIAL/PLATELET
Eosinophils Absolute: 0.2 10*3/uL (ref 0.0–0.5)
MCV: 84.9 fL (ref 79.5–101.0)
MONO%: 7.5 % (ref 0.0–14.0)
NEUT#: 5.2 10*3/uL (ref 1.5–6.5)
RBC: 4.04 10*6/uL (ref 3.70–5.45)
RDW: 18.6 % — ABNORMAL HIGH (ref 11.2–14.5)
WBC: 6.8 10*3/uL (ref 3.9–10.3)
lymph#: 0.8 10*3/uL — ABNORMAL LOW (ref 0.9–3.3)

## 2012-08-30 LAB — FERRITIN: Ferritin: 301 ng/mL — ABNORMAL HIGH (ref 10–291)

## 2012-08-30 NOTE — Progress Notes (Signed)
OFFICE PROGRESS NOTE  Interval history:  Shelby Lyons returns as scheduled. To review, she is a 61 year old woman with multifactorial anemia primarily due to iron deficiency with components of B12 deficiency and anemia of chronic renal failure. She was unable to tolerate oral iron do to severe constipation. She received a dose of Feraheme 1020 mg on 06/21/2012. She is seen today for scheduled followup.  Shelby Lyons has not noted improvement in her energy level despite correction of the hemoglobin into normal range. She was actually concerned that her hemoglobin was lower. She tolerated the iron infusion without difficulty. Specifically no signs of allergic reaction.   Objective: Blood pressure 124/67, pulse 69, temperature 97 F (36.1 C), temperature source Oral, resp. rate 20, height 5\' 5"  (1.651 m), weight 205 lb 9.6 oz (93.26 kg).  Oropharynx is without thrush or ulceration. Lungs are clear. No wheezes or rales. Regular cardiac rhythm. Abdomen is soft and nontender. No organomegaly. Extremities are without edema. Calves are soft and nontender.  Lab Results: Lab Results  Component Value Date   WBC 6.8 08/30/2012   HGB 11.8 08/30/2012   HCT 34.3* 08/30/2012   MCV 84.9 08/30/2012   PLT 209 08/30/2012    Chemistry:    Chemistry      Component Value Date/Time   NA 139 07/06/2012 1053   K 3.9 07/06/2012 1053   CL 99 07/06/2012 1053   CO2 30 07/06/2012 1053   BUN 30* 07/06/2012 1053   CREATININE 1.2 07/06/2012 1053      Component Value Date/Time   CALCIUM 9.4 07/06/2012 1053   ALKPHOS 80 12/30/2010 0328   AST 15 12/30/2010 0328   ALT 10 12/30/2010 0328   BILITOT 0.4 12/30/2010 0328       Studies/Results: No results found.  Medications: I have reviewed the patient's current medications.  Assessment/Plan:  1. Multifactorial anemia, primarily due to iron deficiency with components of B12 deficiency and anemia of chronic renal failure with creatinine clearance of 42 mL per minute. She  responded to oral iron but was unable to tolerate due to severe constipation. She was given a dose of Feraheme 1020 mg on 06/21/2012.  2. Insulin-dependent diabetes. 3. Hypertension.  Disposition-Shelby Lyons's hemoglobin has corrected into normal range following IV iron. She will return in 3 months for a CBC and iron studies. She will return for a followup visit in 6 months. At today's visit Shelby Lyons inquired about proceeding with a cholecystectomy. Dr. Cyndie Chime clears her for this surgery from a hematologic perspective. She will contact the office prior to her next visit with any problems.  Plan reviewed with Dr. Cyndie Chime.  Shelby Lyons ANP/GNP-BC   CC Dr. Feliciana Rossetti, Dr. Charlton Haws, Dr. Maryruth Bun, [facility]

## 2012-08-30 NOTE — Telephone Encounter (Signed)
gv and printed appts for pt....sed

## 2012-08-31 ENCOUNTER — Telehealth: Payer: Self-pay | Admitting: *Deleted

## 2012-08-31 NOTE — Telephone Encounter (Signed)
Message copied by Orbie Hurst on Tue Aug 31, 2012 11:49 AM ------      Message from: Levert Feinstein      Created: Tue Aug 31, 2012  7:37 AM       FYI:  The tank is full: high normal storage iron

## 2012-08-31 NOTE — Telephone Encounter (Signed)
Called patient and let her know that lab tests showed a high normal storage iron.  She appreciated the call

## 2012-10-12 ENCOUNTER — Encounter: Payer: Self-pay | Admitting: Cardiovascular Disease

## 2012-11-03 ENCOUNTER — Encounter: Payer: Self-pay | Admitting: Cardiovascular Disease

## 2012-11-23 ENCOUNTER — Other Ambulatory Visit: Payer: BC Managed Care – PPO | Admitting: Lab

## 2013-01-08 ENCOUNTER — Telehealth: Payer: Self-pay | Admitting: Oncology

## 2013-01-08 NOTE — Telephone Encounter (Signed)
Called pt,left message, r/s appt from 3/21 to 3/31 due to MD PAL

## 2013-01-31 ENCOUNTER — Ambulatory Visit (INDEPENDENT_AMBULATORY_CARE_PROVIDER_SITE_OTHER): Payer: BC Managed Care – PPO | Admitting: Physician Assistant

## 2013-01-31 ENCOUNTER — Encounter: Payer: Self-pay | Admitting: Physician Assistant

## 2013-01-31 VITALS — BP 128/70 | HR 67 | Ht 65.0 in | Wt 225.1 lb

## 2013-01-31 DIAGNOSIS — Z01810 Encounter for preprocedural cardiovascular examination: Secondary | ICD-10-CM

## 2013-01-31 DIAGNOSIS — I10 Essential (primary) hypertension: Secondary | ICD-10-CM

## 2013-01-31 DIAGNOSIS — I2510 Atherosclerotic heart disease of native coronary artery without angina pectoris: Secondary | ICD-10-CM

## 2013-01-31 DIAGNOSIS — I5042 Chronic combined systolic (congestive) and diastolic (congestive) heart failure: Secondary | ICD-10-CM

## 2013-01-31 DIAGNOSIS — E785 Hyperlipidemia, unspecified: Secondary | ICD-10-CM

## 2013-01-31 DIAGNOSIS — N189 Chronic kidney disease, unspecified: Secondary | ICD-10-CM

## 2013-01-31 NOTE — Progress Notes (Signed)
[address] [place], [place]  [address] Phone: [phone], Fax:  [phone]  Date:  01/31/2013   ID:  SKIE VITRANO, DOB Jan 28, 1951, MRN [id number]  PCP:  Feliciana Rossetti, MD  Primary Cardiologist:  Dr. Charlton Haws     History of Present Illness: Shelby Lyons is a 62 y.o. female who returns for surgical clearance.  She needs a cholecystectomy with Dr. Earney Navy in [facility] 02/11/13.  She has a history of CAD, status post anterolateral MI in 1994, lateral MI 9/05 treated with a DES to the CFX and RCA and subsequent BMS to the CFX in the setting of dissection, DES to the RCA in 04/2005 and subsequent CABG x3 in 12/2010, combined systolic and diastolic CHF, HTN, DM2, GERD, microcytic anemia.  Myoview 4/13: AS and IL HK with fixed defects, no ischemia, EF 29%. Echocardiogram 4/13: EF 45%, distal inferior/inferoseptal/apical HK, moderate MR, moderate LAE, PASP 63.  Last seen by Dr. Charlton Haws 7/13.  She had been cleared for her cholecystectomy after her Myoview last year. However, this was never performed. She is now ready to proceed. She denies any chest discomfort. She can achieve 4 METs without symptoms consistent with angina. She notes dyspnea with more extreme activities. She probably describes NYHA class II-IIb symptoms. She denies syncope, orthopnea, PND or significant pedal edema  Labs (1/12):  LDL 108 Labs (5/13):  TSH 6.96 Labs (7/13):  K 3.9, creatinine 1.2 Labs (9/13):  Hgb 11.8  Wt Readings from Last 3 Encounters:  01/31/13 225 lb 1.9 oz (102.114 kg)  08/30/12 205 lb 9.6 oz (93.26 kg)  07/06/12 195 lb (88.451 kg)     Past Medical History  Diagnosis Date  . CAD (coronary artery disease)     a. antlat MI 1994;  b. 9/05 Lat MI - DES LCX & RCA w/ subseq BMS to LCX 2/2 dissection;  c. 04/2005 DES to RCA;  d. 12/2010 CABG x 3: LIMA->LAD, VG->LCX, VG->PDA;  d. Myoview 03/2012 Antsept/inflat AK w/ fixed defects in those areas.  No ischemia.  EF 29% w/  global HK.  . Bradycardia     after cardioversion   . HTN (hypertension)   . Chronic combined systolic and diastolic CHF (congestive heart failure)     a. 10/2011 Echo:  EF 55-60% Antsept AK, Gr4 DD, irreversible restrictive pattern, Mod Dil LA;  03/2012 Echo: EF 45%, dist inf/infsept/ap HK, mod MR, mod dil LA, PASP  . DM2 (diabetes mellitus, type 2)   . Anxiety   . GERD (gastroesophageal reflux disease)   . Allergic rhinitis   . Obesity   . Abdominal pain, acute, right upper quadrant     a. GB dzs - pending cholecystectomy  . Microcytic anemia     a. s/p 1 u prbc's 04/2012; b. Colonoscopy 5/13 "okay"    Current Outpatient Prescriptions  Medication Sig Dispense Refill  . albuterol (PROAIR HFA) 108 (90 BASE) MCG/ACT inhaler Inhale 2 puffs into the lungs every 6 (six) hours as needed.        . ALPRAZolam (XANAX) 0.5 MG tablet Take 0.5 mg by mouth daily as needed.        Marland Kitchen atorvastatin (LIPITOR) 80 MG tablet Take 80 mg by mouth daily.        . bisacodyl (DULCOLAX) 5 MG EC tablet Take 5 mg by mouth daily as needed.      . cetirizine (ZYRTEC) 10 MG tablet Take 10 mg by mouth daily.      Marland Kitchen  clindamycin (CLEOCIN) 300 MG capsule       . COLCRYS 0.6 MG tablet       . cyanocobalamin (,VITAMIN B-12,) 1000 MCG/ML injection Inject 1,000 mcg into the muscle every 14 (fourteen) days. Weekly x 3 wks then montly 04/28/12      . fluticasone (FLONASE) 50 MCG/ACT nasal spray Place into the nose as needed.        Marland Kitchen glucose blood (ACCU-CHEK AVIVA PLUS) test strip 1 each by Other route 3 (three) times daily. Use as instructed  100 each  1  . insulin aspart (NOVOLOG PENFILL) 100 UNIT/ML injection Inject into the skin 2 (two) times daily as needed. 12-20 units      . insulin glargine (LANTUS) 100 UNIT/ML injection Inject 30 Units into the skin 2 (two) times daily.       . metoprolol succinate (TOPROL-XL) 25 MG 24 hr tablet Take 12.5 mg by mouth 2 (two) times daily.       . nitroGLYCERIN (NITROSTAT) 0.4 MG  SL tablet Place 1 tablet (0.4 mg total) under the tongue every 5 (five) minutes as needed.  25 tablet  6  . omeprazole (PRILOSEC) 40 MG capsule Take 1 tablet by mouth Daily.      . ondansetron (ZOFRAN) 4 MG tablet Take 4 mg by mouth every 8 (eight) hours as needed.      . quinapril (ACCUPRIL) 10 MG tablet Take 10 mg by mouth at bedtime.      Marland Kitchen rOPINIRole (REQUIP) 0.5 MG tablet Take 0.5 mg by mouth Daily.      . sertraline (ZOLOFT) 100 MG tablet Take 100 mg by mouth daily.        Marland Kitchen spironolactone (ALDACTONE) 25 MG tablet       . torsemide (DEMADEX) 20 MG tablet        No current facility-administered medications for this visit.    Allergies:    Allergies  Allergen Reactions  . Ativan (Lorazepam)   . Penicillins   . Sulfa Drugs Cross Reactors     Social History:  The patient  reports that she has never smoked. She has never used smokeless tobacco. She reports that she does not drink alcohol.   ROS:  Please see the history of present illness.   She has a nonproductive cough that she attributes to sinus drainage.   All other systems reviewed and negative.   PHYSICAL EXAM: VS:  BP 128/70  Pulse 67  Ht 5\' 5"  (1.651 m)  Wt 225 lb 1.9 oz (102.114 kg)  BMI 37.46 kg/m2  SpO2 94% Well nourished, well developed, in no acute distress HEENT: normal Neck: no JVD Cardiac:  normal S1, S2; RRR; no murmur Lungs:  clear to auscultation bilaterally, no wheezing, rhonchi or rales Abd: soft, nontender, no hepatomegaly Ext: trace to 1+ bilateral LE edema Skin: warm and dry Neuro:  CNs 2-12 intact, no focal abnormalities noted  EKG:  NSR, HR 64, LAD,  Poor R wave progression, lateral T-wave inversions, no change since prior tracing     ASSESSMENT AND PLAN:  1. Surgical Clearance:  She does not have any unstable cardiac conditions. She had a low risk Myoview 03/2012. She is able to achieve 4 METs without anginal symptoms. Her volume status is stable. She is now followed by nephrologist. She remains  in sinus rhythm. According to ACC/AHA guidelines, she does not require any further cardiac workup prior to her noncardiac procedure. We would prefer that she remain on aspirin throughout.  If the bleeding risk is too great, aspirin should be resumed postoperatively as soon as it is felt to be safe. Continue Toprol throughout perioperative period to reduce risk of CV complications.  Close attention will need to be paid to volume status to avoid volume overload.   2. Coronary Artery Disease: Stable. No angina. Continue aspirin and statin. 3. Chronic Combined Systolic and Diastolic CHF: Stable volume. Continue current therapy. 4. Hypertension:  Controlled. 5. Hyperlipidemia: Continue statin. 6. Chronic Kidney Disease:  Managed by nephrology.   7. Disposition:  Follow up with Dr. Charlton Haws in 6 mos.   Luna Glasgow, PA-C  8:54 AM 01/31/2013

## 2013-01-31 NOTE — Patient Instructions (Addendum)
Your physician wants you to follow-up in: 6 MONTHS WITH DR. Eden Emms... You will receive a reminder letter in the mail two months in advance. If you don't receive a letter, please call our office to schedule the follow-up appointment.  NO CHANGES WERE MADE TODAY  Your physician recommends that you continue on your current medications as directed. Please refer to the Current Medication list given to you today.

## 2013-02-25 ENCOUNTER — Ambulatory Visit: Payer: BC Managed Care – PPO | Admitting: Oncology

## 2013-02-25 ENCOUNTER — Other Ambulatory Visit: Payer: BC Managed Care – PPO | Admitting: Lab

## 2013-03-11 ENCOUNTER — Other Ambulatory Visit: Payer: BC Managed Care – PPO | Admitting: Lab

## 2013-03-11 ENCOUNTER — Ambulatory Visit: Payer: BC Managed Care – PPO | Admitting: Oncology

## 2013-03-13 ENCOUNTER — Encounter: Payer: Self-pay | Admitting: Oncology

## 2013-03-13 NOTE — Progress Notes (Signed)
62 year old woman with multifactorial anemia primarily due to iron deficiency with components of B12 deficiency and anemia of chronic renal failure. She was unable to tolerate oral iron do to severe constipation. She recently received a dose of parenteral iron here in July 2013. She failed to report for today's visit. She will be rescheduled.

## 2013-03-19 ENCOUNTER — Other Ambulatory Visit: Payer: Self-pay | Admitting: Oncology

## 2013-03-19 DIAGNOSIS — D509 Iron deficiency anemia, unspecified: Secondary | ICD-10-CM

## 2013-03-21 ENCOUNTER — Telehealth: Payer: Self-pay | Admitting: Oncology

## 2013-03-21 NOTE — Telephone Encounter (Signed)
, °

## 2013-05-31 ENCOUNTER — Other Ambulatory Visit: Payer: BC Managed Care – PPO | Admitting: Lab

## 2013-05-31 ENCOUNTER — Ambulatory Visit: Payer: BC Managed Care – PPO | Admitting: Oncology

## 2013-05-31 ENCOUNTER — Encounter: Payer: Self-pay | Admitting: Oncology

## 2013-05-31 NOTE — Progress Notes (Signed)
62 year old woman followed for multifactorial anemia. She has required parenteral iron infusions in the past since oral iron causes severe constipation. She failed to report for her visit today. She will be rescheduled.

## 2014-02-04 ENCOUNTER — Encounter: Payer: Self-pay | Admitting: Oncology

## 2014-06-07 HISTORY — PX: HEART TRANSPLANT: SHX268

## 2015-05-14 ENCOUNTER — Emergency Department (HOSPITAL_COMMUNITY): Payer: BC Managed Care – PPO

## 2015-05-14 ENCOUNTER — Encounter (HOSPITAL_BASED_OUTPATIENT_CLINIC_OR_DEPARTMENT_OTHER): Payer: BC Managed Care – PPO | Attending: Plastic Surgery

## 2015-05-14 ENCOUNTER — Observation Stay (HOSPITAL_COMMUNITY)
Admission: EM | Admit: 2015-05-14 | Discharge: 2015-05-14 | Disposition: A | Discharge status: Other Acute Inpatient Hospital | Payer: BC Managed Care – PPO | Attending: Emergency Medicine | Admitting: Emergency Medicine

## 2015-05-14 ENCOUNTER — Encounter (HOSPITAL_COMMUNITY): Payer: Self-pay | Admitting: Emergency Medicine

## 2015-05-14 DIAGNOSIS — K219 Gastro-esophageal reflux disease without esophagitis: Secondary | ICD-10-CM | POA: Insufficient documentation

## 2015-05-14 DIAGNOSIS — R4182 Altered mental status, unspecified: Principal | ICD-10-CM | POA: Insufficient documentation

## 2015-05-14 DIAGNOSIS — E119 Type 2 diabetes mellitus without complications: Secondary | ICD-10-CM | POA: Diagnosis not present

## 2015-05-14 DIAGNOSIS — Z7951 Long term (current) use of inhaled steroids: Secondary | ICD-10-CM | POA: Insufficient documentation

## 2015-05-14 DIAGNOSIS — Z880 Allergy status to penicillin: Secondary | ICD-10-CM | POA: Diagnosis not present

## 2015-05-14 DIAGNOSIS — D509 Iron deficiency anemia, unspecified: Secondary | ICD-10-CM | POA: Insufficient documentation

## 2015-05-14 DIAGNOSIS — E669 Obesity, unspecified: Secondary | ICD-10-CM | POA: Insufficient documentation

## 2015-05-14 DIAGNOSIS — Z79899 Other long term (current) drug therapy: Secondary | ICD-10-CM | POA: Diagnosis not present

## 2015-05-14 DIAGNOSIS — Z955 Presence of coronary angioplasty implant and graft: Secondary | ICD-10-CM | POA: Insufficient documentation

## 2015-05-14 DIAGNOSIS — Z951 Presence of aortocoronary bypass graft: Secondary | ICD-10-CM | POA: Insufficient documentation

## 2015-05-14 DIAGNOSIS — F419 Anxiety disorder, unspecified: Secondary | ICD-10-CM | POA: Insufficient documentation

## 2015-05-14 DIAGNOSIS — I252 Old myocardial infarction: Secondary | ICD-10-CM | POA: Diagnosis not present

## 2015-05-14 DIAGNOSIS — Z79891 Long term (current) use of opiate analgesic: Secondary | ICD-10-CM | POA: Insufficient documentation

## 2015-05-14 DIAGNOSIS — I2510 Atherosclerotic heart disease of native coronary artery without angina pectoris: Secondary | ICD-10-CM | POA: Insufficient documentation

## 2015-05-14 DIAGNOSIS — Z794 Long term (current) use of insulin: Secondary | ICD-10-CM | POA: Insufficient documentation

## 2015-05-14 DIAGNOSIS — R4781 Slurred speech: Secondary | ICD-10-CM | POA: Insufficient documentation

## 2015-05-14 DIAGNOSIS — Z888 Allergy status to other drugs, medicaments and biological substances status: Secondary | ICD-10-CM | POA: Insufficient documentation

## 2015-05-14 DIAGNOSIS — G451 Carotid artery syndrome (hemispheric): Secondary | ICD-10-CM

## 2015-05-14 DIAGNOSIS — I10 Essential (primary) hypertension: Secondary | ICD-10-CM | POA: Diagnosis not present

## 2015-05-14 DIAGNOSIS — I5042 Chronic combined systolic (congestive) and diastolic (congestive) heart failure: Secondary | ICD-10-CM | POA: Diagnosis not present

## 2015-05-14 DIAGNOSIS — R404 Transient alteration of awareness: Secondary | ICD-10-CM | POA: Diagnosis present

## 2015-05-14 DIAGNOSIS — Z941 Heart transplant status: Secondary | ICD-10-CM | POA: Diagnosis not present

## 2015-05-14 DIAGNOSIS — Z8249 Family history of ischemic heart disease and other diseases of the circulatory system: Secondary | ICD-10-CM | POA: Diagnosis not present

## 2015-05-14 DIAGNOSIS — Z882 Allergy status to sulfonamides status: Secondary | ICD-10-CM | POA: Diagnosis not present

## 2015-05-14 HISTORY — DX: Heart transplant status: Z94.1

## 2015-05-14 LAB — COMPREHENSIVE METABOLIC PANEL
ALBUMIN: 2.6 g/dL — AB (ref 3.5–5.0)
ALK PHOS: 101 U/L (ref 38–126)
ALT: 13 U/L — AB (ref 14–54)
ALT: 12 U/L — ABNORMAL LOW (ref 14–54)
ANION GAP: 11 (ref 5–15)
AST: 29 U/L (ref 15–41)
AST: 21 U/L (ref 15–41)
Albumin: 2.6 g/dL — ABNORMAL LOW (ref 3.5–5.0)
Alkaline Phosphatase: 108 U/L (ref 38–126)
Anion gap: 14 (ref 5–15)
BILIRUBIN TOTAL: 0.3 mg/dL (ref 0.3–1.2)
BUN: 21 mg/dL — AB (ref 6–20)
BUN: 21 mg/dL — AB (ref 6–20)
CALCIUM: 9.3 mg/dL (ref 8.9–10.3)
CALCIUM: 9.5 mg/dL (ref 8.9–10.3)
CHLORIDE: 95 mmol/L — AB (ref 101–111)
CO2: 25 mmol/L (ref 22–32)
CO2: 27 mmol/L (ref 22–32)
CREATININE: 1.26 mg/dL — AB (ref 0.44–1.00)
Chloride: 96 mmol/L — ABNORMAL LOW (ref 101–111)
Creatinine, Ser: 1.17 mg/dL — ABNORMAL HIGH (ref 0.44–1.00)
GFR calc Af Amer: 51 mL/min — ABNORMAL LOW (ref >60)
GFR calc non Af Amer: 44 mL/min — ABNORMAL LOW (ref >60)
GFR calc non Af Amer: 48 mL/min — ABNORMAL LOW (ref >60)
GFR, EST AFRICAN AMERICAN: 56 mL/min — AB (ref >60)
Glucose, Bld: 95 mg/dL (ref 65–99)
Glucose, Bld: 112 mg/dL — ABNORMAL HIGH (ref 65–99)
Potassium: 3.6 mmol/L (ref 3.5–5.1)
Potassium: 3.4 mmol/L — ABNORMAL LOW (ref 3.5–5.1)
SODIUM: 135 mmol/L (ref 135–145)
Sodium: 133 mmol/L — ABNORMAL LOW (ref 135–145)
TOTAL PROTEIN: 5.8 g/dL — AB (ref 6.5–8.1)
Total Bilirubin: 0.3 mg/dL (ref 0.3–1.2)
Total Protein: 5.6 g/dL — ABNORMAL LOW (ref 6.5–8.1)

## 2015-05-14 LAB — URINALYSIS, ROUTINE W REFLEX MICROSCOPIC
Bilirubin Urine: NEGATIVE
GLUCOSE, UA: NEGATIVE mg/dL
Hgb urine dipstick: NEGATIVE
Ketones, ur: NEGATIVE mg/dL
Leukocytes, UA: NEGATIVE
NITRITE: NEGATIVE
PROTEIN: 100 mg/dL — AB
Specific Gravity, Urine: 1.005 (ref 1.005–1.030)
UROBILINOGEN UA: 0.2 mg/dL (ref 0.0–1.0)
pH: 7 (ref 5.0–8.0)

## 2015-05-14 LAB — APTT: aPTT: 25 seconds (ref 24–37)

## 2015-05-14 LAB — URINE MICROSCOPIC-ADD ON

## 2015-05-14 LAB — DIFFERENTIAL
BASOS ABS: 0.1 10*3/uL (ref 0.0–0.1)
Basophils Relative: 1 % (ref 0–1)
EOS PCT: 2 % (ref 0–5)
Eosinophils Absolute: 0.2 10*3/uL (ref 0.0–0.7)
LYMPHS ABS: 1 10*3/uL (ref 0.7–4.0)
Lymphocytes Relative: 10 % — ABNORMAL LOW (ref 12–46)
Monocytes Absolute: 1.1 10*3/uL — ABNORMAL HIGH (ref 0.1–1.0)
Monocytes Relative: 12 % (ref 3–12)
Neutro Abs: 7.3 10*3/uL (ref 1.7–7.7)
Neutrophils Relative %: 75 % (ref 43–77)

## 2015-05-14 LAB — RAPID URINE DRUG SCREEN, HOSP PERFORMED
AMPHETAMINES: NOT DETECTED
BENZODIAZEPINES: NOT DETECTED
Barbiturates: NOT DETECTED
Cocaine: NOT DETECTED
OPIATES: NOT DETECTED
Tetrahydrocannabinol: NOT DETECTED

## 2015-05-14 LAB — PROTIME-INR
INR: 1.12 (ref 0.00–1.49)
Prothrombin Time: 14.6 seconds (ref 11.6–15.2)

## 2015-05-14 LAB — I-STAT CHEM 8, ED
BUN: 24 mg/dL — ABNORMAL HIGH (ref 6–20)
CALCIUM ION: 1.13 mmol/L (ref 1.13–1.30)
CHLORIDE: 96 mmol/L — AB (ref 101–111)
Creatinine, Ser: 1.2 mg/dL — ABNORMAL HIGH (ref 0.44–1.00)
GLUCOSE: 100 mg/dL — AB (ref 65–99)
HCT: 35 % — ABNORMAL LOW (ref 36.0–46.0)
Hemoglobin: 11.9 g/dL — ABNORMAL LOW (ref 12.0–15.0)
Potassium: 3.3 mmol/L — ABNORMAL LOW (ref 3.5–5.1)
Sodium: 133 mmol/L — ABNORMAL LOW (ref 135–145)
TCO2: 24 mmol/L (ref 0–100)

## 2015-05-14 LAB — CBC
HCT: 34.7 % — ABNORMAL LOW (ref 36.0–46.0)
Hemoglobin: 10.9 g/dL — ABNORMAL LOW (ref 12.0–15.0)
MCH: 26 pg (ref 26.0–34.0)
MCHC: 31.4 g/dL (ref 30.0–36.0)
MCV: 82.8 fL (ref 78.0–100.0)
Platelets: 322 10*3/uL (ref 150–400)
RBC: 4.19 MIL/uL (ref 3.87–5.11)
RDW: 17.1 % — ABNORMAL HIGH (ref 11.5–15.5)
WBC: 9.7 10*3/uL (ref 4.0–10.5)

## 2015-05-14 LAB — I-STAT CG4 LACTIC ACID, ED
LACTIC ACID, VENOUS: 1.26 mmol/L (ref 0.5–2.0)
Lactic Acid, Venous: 0.81 mmol/L (ref 0.5–2.0)

## 2015-05-14 LAB — AMMONIA: AMMONIA: 16 umol/L (ref 9–35)

## 2015-05-14 LAB — GLUCOSE, CAPILLARY: Glucose-Capillary: 181 mg/dL — ABNORMAL HIGH (ref 65–99)

## 2015-05-14 LAB — TSH: TSH: 1.795 u[IU]/mL (ref 0.350–4.500)

## 2015-05-14 LAB — ETHANOL

## 2015-05-14 LAB — I-STAT TROPONIN, ED: Troponin i, poc: 0.05 ng/mL (ref 0.00–0.08)

## 2015-05-14 MED ORDER — SODIUM CHLORIDE 0.9 % IV BOLUS (SEPSIS)
1000.0000 mL | Freq: Once | INTRAVENOUS | Status: AC
  Administered 2015-05-14: 1000 mL via INTRAVENOUS

## 2015-05-14 NOTE — ED Notes (Signed)
Pt sts pain in right abdomen is soreness remaining from a fall in the hospital last week.

## 2015-05-14 NOTE — Consult Note (Signed)
Referring Physician: Romeo Apple    Chief Complaint: Difficulty with speech  HPI: Shelby Lyons is an 64 y.o. female with multiple medical problems who was with her husband this morning.  He reports that she was at baseline this morning.  While in the car she seemed to be doing fine.  He got out of the car for breakfast and when he came back she was slurring her words.  At the wound clinic they felt that she had right sided weakness as well.  EMS was called at that time and the patient was brought in as a code stroke.  Initial NIHSS of 11.   The husband reports that the patient doe shave intermittent episodes of confusion.    Date last known well: Date: 05/14/2015 Time last known well: Time: 07:00 tPA Given: No: Resolution of symptoms  Past Medical History  Diagnosis Date  . CAD (coronary artery disease)     a. antlat MI 1994;  b. 9/05 Lat MI - DES LCX & RCA w/ subseq BMS to LCX 2/2 dissection;  c. 04/2005 DES to RCA;  d. 12/2010 CABG x 3: LIMA->LAD, VG->LCX, VG->PDA;  d. Myoview 03/2012 Antsept/inflat AK w/ fixed defects in those areas.  No ischemia.  EF 29% w/ global HK.  . Bradycardia     after cardioversion   . HTN (hypertension)   . Chronic combined systolic and diastolic CHF (congestive heart failure)     a. 10/2011 Echo:  EF 55-60% Antsept AK, Gr4 DD, irreversible restrictive pattern, Mod Dil LA;  03/2012 Echo: EF 45%, dist inf/infsept/ap HK, mod MR, mod dil LA, PASP  . DM2 (diabetes mellitus, type 2)   . Anxiety   . GERD (gastroesophageal reflux disease)   . Allergic rhinitis   . Obesity   . Abdominal pain, acute, right upper quadrant     a. GB dzs - pending cholecystectomy  . Microcytic anemia     a. s/p 1 u prbc's 04/2012; b. Colonoscopy 5/13 "okay"  . Heart transplanted     Past Surgical History  Procedure Laterality Date  . Ptca of the lad  1994  . Cardiac catheterization  Jan 1998  . Coronary angiography  Nov 2005    with PTCA placement of bare-metal stents x 2 in  the mild and distal left circumflex artery-  . Appendectomy    . Hysterectomy (other)  1998  . Bilateral salpingoophorectomy  1998    Family History  Problem Relation Age of Onset  . Diabetes Mother   . Hyperlipidemia Mother   . Heart attack Father   . Hypertension Sister   . Hypertension Sister    Social History:  reports that she has never smoked. She has never used smokeless tobacco. She reports that she does not drink alcohol. Her drug history is not on file.  Allergies:  Allergies  Allergen Reactions  . Ativan [Lorazepam]   . Penicillins   . Sulfa Drugs Cross Reactors     Medications: I have reviewed the patient's current medications. Prior to Admission:  Current outpatient prescriptions:  .  albuterol (PROAIR HFA) 108 (90 BASE) MCG/ACT inhaler, Inhale 2 puffs into the lungs every 6 (six) hours as needed.  , Disp: , Rfl:  .  ALPRAZolam (XANAX) 0.5 MG tablet, Take 0.5 mg by mouth daily as needed.  , Disp: , Rfl:  .  atorvastatin (LIPITOR) 80 MG tablet, Take 80 mg by mouth daily.  , Disp: , Rfl:  .  bisacodyl (DULCOLAX)  5 MG EC tablet, Take 5 mg by mouth daily as needed., Disp: , Rfl:  .  cetirizine (ZYRTEC) 10 MG tablet, Take 10 mg by mouth daily., Disp: , Rfl:  .  clindamycin (CLEOCIN) 300 MG capsule, , Disp: , Rfl:  .  COLCRYS 0.6 MG tablet, , Disp: , Rfl:  .  cyanocobalamin (,VITAMIN B-12,) 1000 MCG/ML injection, Inject 1,000 mcg into the muscle every 14 (fourteen) days. Weekly x 3 wks then montly 04/28/12, Disp: , Rfl:  .  febuxostat (ULORIC) 40 MG tablet, Take 40 mg by mouth daily., Disp: , Rfl:  .  fluticasone (FLONASE) 50 MCG/ACT nasal spray, Place into the nose as needed.  , Disp: , Rfl:  .  Folic Acid-Vit B6-Vit B12 (FOLBEE) 2.5-25-1 MG TABS, Take 1 tablet by mouth daily., Disp: , Rfl:  .  glucose blood (ACCU-CHEK AVIVA PLUS) test strip, 1 each by Other route 3 (three) times daily. Use as instructed, Disp: 100 each, Rfl: 1 .  insulin aspart (NOVOLOG PENFILL) 100  UNIT/ML injection, Inject into the skin 2 (two) times daily as needed. 12-20 units, Disp: , Rfl:  .  insulin glargine (LANTUS) 100 UNIT/ML injection, Inject 30 Units into the skin 2 (two) times daily. , Disp: , Rfl:  .  metoprolol succinate (TOPROL-XL) 25 MG 24 hr tablet, Take 12.5 mg by mouth 2 (two) times daily. , Disp: , Rfl:  .  nitroGLYCERIN (NITROSTAT) 0.4 MG SL tablet, Place 1 tablet (0.4 mg total) under the tongue every 5 (five) minutes as needed., Disp: 25 tablet, Rfl: 6 .  omeprazole (PRILOSEC) 40 MG capsule, Take 1 tablet by mouth Daily., Disp: , Rfl:  .  ondansetron (ZOFRAN) 4 MG tablet, Take 4 mg by mouth every 8 (eight) hours as needed., Disp: , Rfl:  .  quinapril (ACCUPRIL) 10 MG tablet, Take 10 mg by mouth at bedtime., Disp: , Rfl:  .  rOPINIRole (REQUIP) 0.5 MG tablet, Take 0.5 mg by mouth Daily., Disp: , Rfl:  .  sertraline (ZOLOFT) 100 MG tablet, Take 100 mg by mouth daily.  , Disp: , Rfl:  .  spironolactone (ALDACTONE) 25 MG tablet, , Disp: , Rfl:  .  torsemide (DEMADEX) 20 MG tablet, , Disp: , Rfl:   ROS: History obtained from husband  General ROS: negative for - chills, fatigue, fever, night sweats, weight gain or weight loss Psychological ROS: intermittent periods of confusion Ophthalmic ROS: negative for - blurry vision, double vision, eye pain or loss of vision ENT ROS: negative for - epistaxis, nasal discharge, oral lesions, sore throat, tinnitus or vertigo Allergy and Immunology ROS: negative for - hives or itchy/watery eyes Hematological and Lymphatic ROS: negative for - bleeding problems, bruising or swollen lymph nodes Endocrine ROS: negative for - galactorrhea, hair pattern changes, polydipsia/polyuria or temperature intolerance Respiratory ROS: negative for - cough, hemoptysis, shortness of breath or wheezing Cardiovascular ROS: negative for - chest pain, dyspnea on exertion, edema or irregular heartbeat Gastrointestinal ROS: negative for - abdominal pain,  diarrhea, hematemesis, nausea/vomiting or stool incontinence Genito-Urinary ROS: negative for - dysuria, hematuria, incontinence or urinary frequency/urgency Musculoskeletal ROS: knee pain with unhealed wound Neurological ROS: as noted in HPI Dermatological ROS: negative for rash and skin lesion changes  Physical Examination: Blood pressure 152/71, pulse 86, temperature 97.6 F (36.4 C), temperature source Rectal, resp. rate 17, weight 79.5 kg (175 lb 4.3 oz), SpO2 100 %.  HEENT-  Normocephalic, no lesions, without obvious abnormality.  Normal external eye and conjunctiva.  Normal TM's bilaterally.  Normal auditory canals and external ears. Normal external nose, mucus membranes and septum.  Normal pharynx. Cardiovascular- S1, S2 normal, pulses palpable throughout   Lungs- chest clear, no wheezing, rales, normal symmetric air entry Abdomen- soft, non-tender; bowel sounds normal; no masses,  no organomegaly Extremities- no edema Lymph-no adenopathy palpable Musculoskeletal-dressing on right knee Skin-ecchymoses on arms bilaterally  Neurological Examination Mental Status: Alert, oriented, thought content appropriate.  Speech fluent with spontaneous speech but does have some word finding difficulties.  Able to follow 3 step commands without difficulty. Cranial Nerves: II: Discs flat bilaterally; Visual fields grossly normal, pupils equal, round, reactive to light and accommodation III,IV, VI: ptosis not present, extra-ocular motions intact bilaterally V,VII: decrease in the right NLF, facial light touch sensation normal bilaterally VIII: hearing normal bilaterally IX,X: gag reflex present XI: bilateral shoulder shrug XII: midline tongue extension Motor: Right : Upper extremity   5/5        Left:     Upper extremity   5/5  Lower extremity   Unable to lift secondary to pain     Lower extremity   5/5 Tone and bulk:normal tone throughout; no atrophy noted Sensory: Pinprick and light touch  intact throughout, bilaterally Deep Tendon Reflexes: 2+ and symmetric in the upper extremities, 2+ at the left knee, unable to check the right knee, AJ's absent bilaterally Plantars: Right: mute   Left: upgoing Cerebellar: normal finger-to-nose testing bilaterally Gait: not tested due to safety concerns   Laboratory Studies:  Basic Metabolic Panel:  Recent Labs Lab 05/14/15 0942 05/14/15 0957  NA 135 133*  K 3.6 3.3*  CL 96* 96*  CO2 25  --   GLUCOSE 95 100*  BUN 21* 24*  CREATININE 1.26* 1.20*  CALCIUM 9.3  --     Liver Function Tests:  Recent Labs Lab 05/14/15 0942  AST 29  ALT 13*  ALKPHOS 101  BILITOT 0.3  PROT 5.8*  ALBUMIN 2.6*   No results for input(s): LIPASE, AMYLASE in the last 168 hours. No results for input(s): AMMONIA in the last 168 hours.  CBC:  Recent Labs Lab 05/14/15 0957 05/14/15 1000  WBC  --  9.7  NEUTROABS  --  7.3  HGB 11.9* 10.9*  HCT 35.0* 34.7*  MCV  --  82.8  PLT  --  322    Cardiac Enzymes: No results for input(s): CKTOTAL, CKMB, CKMBINDEX, TROPONINI in the last 168 hours.  BNP: Invalid input(s): POCBNP  CBG: No results for input(s): GLUCAP in the last 168 hours.  Microbiology: Results for orders placed or performed during the hospital encounter of 12/26/10  MRSA PCR Screening     Status: None   Collection Time: 12/26/10  9:21 AM  Result Value Ref Range Status   MRSA by PCR  NEGATIVE Final    NEGATIVE        The GeneXpert MRSA Assay (FDA approved for NASAL specimens only), is one component of a comprehensive MRSA colonization surveillance program. It is not intended to diagnose MRSA infection nor to guide or monitor treatment for MRSA infections.    Coagulation Studies:  Recent Labs  05/14/15 1000  LABPROT 14.6  INR 1.12    Urinalysis: No results for input(s): COLORURINE, LABSPEC, PHURINE, GLUCOSEU, HGBUR, BILIRUBINUR, KETONESUR, PROTEINUR, UROBILINOGEN, NITRITE, LEUKOCYTESUR in the last 168  hours.  Invalid input(s): APPERANCEUR  Lipid Panel:    Component Value Date/Time   CHOL  12/27/2010 0720    190  ATP III CLASSIFICATION:  <200     mg/dL   Desirable  161-096  mg/dL   Borderline High  >=045    mg/dL   High          TRIG 409* 12/27/2010 0720   HDL 38* 12/27/2010 0720   CHOLHDL 5.0 12/27/2010 0720   VLDL 44* 12/27/2010 0720   LDLCALC * 12/27/2010 0720    108        Total Cholesterol/HDL:CHD Risk Coronary Heart Disease Risk Table                     Men   Women  1/2 Average Risk   3.4   3.3  Average Risk       5.0   4.4  2 X Average Risk   9.6   7.1  3 X Average Risk  23.4   11.0        Use the calculated Patient Ratio above and the CHD Risk Table to determine the patient's CHD Risk.        ATP III CLASSIFICATION (LDL):  <100     mg/dL   Optimal  811-914  mg/dL   Near or Above                    Optimal  130-159  mg/dL   Borderline  782-956  mg/dL   High  >213     mg/dL   Very High    [id number]:  Lab Results  Component Value Date   HGBA1C * 12/26/2010    10.5 (NOTE)                                                                       According to the ADA Clinical Practice Recommendations for 2011, when HbA1c is used as a screening test:   >=6.5%   Diagnostic of Diabetes Mellitus           (if abnormal result  is confirmed)  5.7-6.4%   Increased risk of developing Diabetes Mellitus  References:Diagnosis and Classification of Diabetes Mellitus,Diabetes Care,2011,34(Suppl 1):S62-S69 and Standards of Medical Care in         Diabetes - 2011,Diabetes Care,2011,34  (Suppl 1):S11-S61.    Urine Drug Screen:  No results found for: LABOPIA, COCAINSCRNUR, LABBENZ, AMPHETMU, THCU, LABBARB  Alcohol Level:  Recent Labs Lab 05/14/15 0942  ETH <5    Other results: EKG: sinus rhythm at 89 bpm.  Imaging: Ct Head Wo Contrast  05/14/2015   CLINICAL DATA:  Right arm drawn, right facial droop.  Aphasia.  Code stroke  EXAM: CT HEAD WITHOUT CONTRAST  TECHNIQUE:  Contiguous axial images were obtained from the base of the skull through the vertex without intravenous contrast.  COMPARISON:  None currently available  FINDINGS: Skull and Sinuses:Negative for fracture or destructive process. The mastoids, middle ears, and imaged paranasal sinuses are clear.  Orbits: Bilateral cataract resection.  Brain: No evidence of acute infarction, hemorrhage, hydrocephalus, or mass lesion/mass effect. ASPECTS = 10.  Chronic small vessel disease with ischemic gliosis throughout the bilateral cerebral white matter. Remote appearing infarct in the left lateral lenticulostriate distribution with left caudate head volume loss and anterior limb internal capsule low-density. Unusually focal  and prominent calcification near the left MCA bifurcation, stable from face CT in 2009.  Critical Value/emergent results were called by telephone at the time of interpretation on 05/14/2015 at 10:08 am to Dr. Thad Rangereynolds, who verbally acknowledged these results.  IMPRESSION: 1. No intracranial hemorrhage or visible acute infarct. 2. Chronic small vessel disease and remote left lateral lenticulostriate distribution infarction. 3. Calcification near the distal left M1 segment could reflect prominent atherosclerotic plaque, aneurysm, or remote dissection.   Electronically Signed   By: Marnee SpringJonathon  Watts M.D.   On: 05/14/2015 10:13    Assessment: 64 y.o. female with multiple medical problems including being s/p heart transplant who presented with complaints of right sided weakness and slurred speech.  The patient has improved dramatically.   She reports that she continues to feel a little confused.  Head CT personally reviewed and shows no acute abnormalities.  Can not rule out TIA.  Due to the fact that the patient is a heart transplant patient a request has been made that contact be made with [facility] prior to any treatment.  With improvement in symptoms would not consider patient a tPA candidate but should receive further  work up.  Stroke Risk Factors - diabetes mellitus and hypertension  Plan: 1. [facility] to be contacted for further coordination of care.  Case discussed with Dr. Marlowe ShoresHarrison  Loveah Like, MD [facility] [phone] 05/14/2015, 11:34 AM

## 2015-05-14 NOTE — ED Provider Notes (Signed)
CSN: [id number]     Arrival date & time 05/14/15  0944 History   First MD Initiated Contact with Patient 05/14/15 [phone]     Chief Complaint  Patient presents with  . Code Stroke    @EDPCLEARED @ (Consider location/radiation/quality/duration/timing/severity/associated sxs/prior Treatment) Patient is a 64 y.o. female presenting with altered mental status. The history is provided by the patient and the spouse.  Altered Mental Status Presenting symptoms: confusion, lethargy and partial responsiveness   Severity:  Moderate Most recent episode:  Today Episode history:  Continuous Timing:  Constant Progression:  Unchanged Chronicity:  New Context: not a recent change in medication   Associated symptoms: slurred speech and weakness   Associated symptoms: normal movement, no agitation, no difficulty breathing, no fever and no vomiting     Past Medical History  Diagnosis Date  . CAD (coronary artery disease)     a. antlat MI 1994;  b. 9/05 Lat MI - DES LCX & RCA w/ subseq BMS to LCX 2/2 dissection;  c. 04/2005 DES to RCA;  d. 12/2010 CABG x 3: LIMA->LAD, VG->LCX, VG->PDA;  d. Myoview 03/2012 Antsept/inflat AK w/ fixed defects in those areas.  No ischemia.  EF 29% w/ global HK.  . Bradycardia     after cardioversion   . HTN (hypertension)   . Chronic combined systolic and diastolic CHF (congestive heart failure)     a. 10/2011 Echo:  EF 55-60% Antsept AK, Gr4 DD, irreversible restrictive pattern, Mod Dil LA;  03/2012 Echo: EF 45%, dist inf/infsept/ap HK, mod MR, mod dil LA, PASP  . DM2 (diabetes mellitus, type 2)   . Anxiety   . GERD (gastroesophageal reflux disease)   . Allergic rhinitis   . Obesity   . Abdominal pain, acute, right upper quadrant     a. GB dzs - pending cholecystectomy  . Microcytic anemia     a. s/p 1 u prbc's 04/2012; b. Colonoscopy 5/13 "okay"  . Heart transplanted    Past Surgical History  Procedure Laterality Date  . Ptca of the lad  1994  . Cardiac  catheterization  Jan 1998  . Coronary angiography  Nov 2005    with PTCA placement of bare-metal stents x 2 in the mild and distal left circumflex artery-  . Appendectomy    . Hysterectomy (other)  1998  . Bilateral salpingoophorectomy  1998   Family History  Problem Relation Age of Onset  . Diabetes Mother   . Hyperlipidemia Mother   . Heart attack Father   . Hypertension Sister   . Hypertension Sister    History  Substance Use Topics  . Smoking status: Never Smoker   . Smokeless tobacco: Never Used  . Alcohol Use: No   OB History    No data available     Review of Systems  Constitutional: Negative for fever.  Gastrointestinal: Negative for vomiting.  Neurological: Positive for weakness.  Psychiatric/Behavioral: Positive for confusion. Negative for agitation.  All other systems reviewed and are negative.     Allergies  Ativan; Melatonin; Nsaids; Penicillins; and Sulfa antibiotics  Home Medications   Prior to Admission medications   Medication Sig Start Date End Date Taking? Authorizing Provider  albuterol (PROAIR HFA) 108 (90 BASE) MCG/ACT inhaler Inhale 2 puffs into the lungs every 6 (six) hours as needed for wheezing.    Yes Historical Provider, MD  ALPRAZolam Prudy Feeler) 0.5 MG tablet Take 0.5 mg by mouth daily as needed for anxiety.    Yes Historical  Provider, MD  atorvastatin (LIPITOR) 80 MG tablet Take 80 mg by mouth daily.     Yes Historical Provider, MD  bisacodyl (DULCOLAX) 5 MG EC tablet Take 5 mg by mouth daily as needed for mild constipation.    Yes Historical Provider, MD  calcium-vitamin D (OSCAL WITH D) 500-200 MG-UNIT per tablet Take 1 tablet by mouth daily with breakfast.   Yes Historical Provider, MD  cetirizine (ZYRTEC) 10 MG tablet Take 10 mg by mouth daily.   Yes Historical Provider, MD  COLCRYS 0.6 MG tablet Take 0.6 mg by mouth daily.  01/06/13  Yes Historical Provider, MD  cyanocobalamin (,VITAMIN B-12,) 1000 MCG/ML injection Inject 1,000 mcg into  the muscle every 14 (fourteen) days. Weekly x 3 wks then montly 04/28/12   Yes Historical Provider, MD  febuxostat (ULORIC) 40 MG tablet Take 40 mg by mouth daily.   Yes Historical Provider, MD  fluticasone (FLONASE) 50 MCG/ACT nasal spray Place 1 spray into the nose daily as needed for allergies.    Yes Historical Provider, MD  Folic Acid-Vit B6-Vit B12 (FOLBEE) 2.5-25-1 MG TABS Take 1 tablet by mouth daily.   Yes Historical Provider, MD  glucose blood (ACCU-CHEK AVIVA PLUS) test strip 1 each by Other route 3 (three) times daily. Use as instructed 05/20/11  Yes Gordy Savers, MD  HYDROcodone-acetaminophen (NORCO/VICODIN) 5-325 MG per tablet Take 1 tablet by mouth every 4 (four) hours as needed for moderate pain.   Yes Historical Provider, MD  insulin aspart (NOVOLOG PENFILL) 100 UNIT/ML injection Inject 12-20 Units into the skin 2 (two) times daily as needed for high blood sugar. 12-20 units   Yes Historical Provider, MD  insulin glargine (LANTUS) 100 UNIT/ML injection Inject 30 Units into the skin 2 (two) times daily.  01/25/11  Yes Gordy Savers, MD  metoprolol succinate (TOPROL-XL) 25 MG 24 hr tablet Take 12.5 mg by mouth 2 (two) times daily.    Yes Historical Provider, MD  nitroGLYCERIN (NITROSTAT) 0.4 MG SL tablet Place 1 tablet (0.4 mg total) under the tongue every 5 (five) minutes as needed. 07/06/12  Yes Wendall Stade, MD  omeprazole (PRILOSEC) 40 MG capsule Take 1 tablet by mouth Daily. 04/07/12  Yes Historical Provider, MD  ondansetron (ZOFRAN) 4 MG tablet Take 4 mg by mouth every 8 (eight) hours as needed for nausea.    Yes Historical Provider, MD  quinapril (ACCUPRIL) 10 MG tablet Take 10 mg by mouth at bedtime.   Yes Historical Provider, MD  rOPINIRole (REQUIP) 0.5 MG tablet Take 0.5 mg by mouth Daily. 05/19/12  Yes Historical Provider, MD  sertraline (ZOLOFT) 100 MG tablet Take 100 mg by mouth daily.     Yes Historical Provider, MD  spironolactone (ALDACTONE) 25 MG tablet Take 25  mg by mouth daily.  01/06/13  Yes Historical Provider, MD  torsemide (DEMADEX) 20 MG tablet Take 20 mg by mouth daily.  01/06/13  Yes Historical Provider, MD   BP 148/70 mmHg  Pulse 78  Temp(Src) 97.8 F (36.6 C) (Rectal)  Resp 17  Wt 175 lb 4.3 oz (79.5 kg)  SpO2 100% Physical Exam  Constitutional: She appears well-developed and well-nourished. She appears lethargic. No distress.  HENT:  Head: Normocephalic.  Eyes: Conjunctivae are normal.  Neck: Neck supple. No tracheal deviation present.  Cardiovascular: Normal rate and regular rhythm.   Pulmonary/Chest: Effort normal. No respiratory distress. She has no decreased breath sounds.  Abdominal: Soft. She exhibits no distension.  Neurological: She appears  lethargic. She is disoriented. She displays no atrophy and no tremor. No cranial nerve deficit. GCS eye subscore is 4. GCS verbal subscore is 4. GCS motor subscore is 6.  Skin: Skin is warm and dry.  Psychiatric: Her affect is blunt. Her speech is slurred. She is slowed.    ED Course  Procedures (including critical care time) Labs Review Labs Reviewed  URINALYSIS, ROUTINE W REFLEX MICROSCOPIC (NOT AT [facility]) - Abnormal; Notable for the following:    Protein, ur 100 (*)    All other components within normal limits  CBC - Abnormal; Notable for the following:    Hemoglobin 10.9 (*)    HCT 34.7 (*)    RDW 17.1 (*)    All other components within normal limits  DIFFERENTIAL - Abnormal; Notable for the following:    Lymphocytes Relative 10 (*)    Monocytes Absolute 1.1 (*)    All other components within normal limits  COMPREHENSIVE METABOLIC PANEL - Abnormal; Notable for the following:    Sodium 133 (*)    Potassium 3.4 (*)    Chloride 95 (*)    Glucose, Bld 112 (*)    BUN 21 (*)    Creatinine, Ser 1.17 (*)    Total Protein 5.6 (*)    Albumin 2.6 (*)    ALT 12 (*)    GFR calc non Af Amer 48 (*)    GFR calc Af Amer 56 (*)    All other components within normal limits   COMPREHENSIVE METABOLIC PANEL - Abnormal; Notable for the following:    Chloride 96 (*)    BUN 21 (*)    Creatinine, Ser 1.26 (*)    Total Protein 5.8 (*)    Albumin 2.6 (*)    ALT 13 (*)    GFR calc non Af Amer 44 (*)    GFR calc Af Amer 51 (*)    All other components within normal limits  I-STAT CHEM 8, ED - Abnormal; Notable for the following:    Sodium 133 (*)    Potassium 3.3 (*)    Chloride 96 (*)    BUN 24 (*)    Creatinine, Ser 1.20 (*)    Glucose, Bld 100 (*)    Hemoglobin 11.9 (*)    HCT 35.0 (*)    All other components within normal limits  CULTURE, BLOOD (ROUTINE X 2)  CULTURE, BLOOD (ROUTINE X 2)  URINE CULTURE  URINE RAPID DRUG SCREEN (HOSP PERFORMED) NOT AT [facility]  PROTIME-INR  APTT  ETHANOL  TSH  AMMONIA  URINE MICROSCOPIC-ADD ON  TACROLIMUS LEVEL  I-STAT TROPOININ, ED  I-STAT CG4 LACTIC ACID, ED  I-STAT CG4 LACTIC ACID, ED    Imaging Review Dg Chest 2 View  05/14/2015   CLINICAL DATA:  Confusion, RIGHT facial droop, aphasia, history coronary artery disease, hypertension, diabetes, heart transplant  EXAM: CHEST  2 VIEW  COMPARISON:  02/02/2014  FINDINGS: Upper normal size of cardiac silhouette post median sternotomy and by history heart transplant.  Mediastinal contours and pulmonary vascularity normal.  Peribronchial thickening without infiltrate, pleural effusion or pneumothorax.  Bones demineralized.  IMPRESSION: No acute abnormalities.   Electronically Signed   By: Ulyses Southward M.D.   On: 05/14/2015 12:56   Ct Head Wo Contrast  05/14/2015   CLINICAL DATA:  Right arm drawn, right facial droop.  Aphasia.  Code stroke  EXAM: CT HEAD WITHOUT CONTRAST  TECHNIQUE: Contiguous axial images were obtained from the base of the skull  through the vertex without intravenous contrast.  COMPARISON:  None currently available  FINDINGS: Skull and Sinuses:Negative for fracture or destructive process. The mastoids, middle ears, and imaged paranasal sinuses are clear.  Orbits:  Bilateral cataract resection.  Brain: No evidence of acute infarction, hemorrhage, hydrocephalus, or mass lesion/mass effect. ASPECTS = 10.  Chronic small vessel disease with ischemic gliosis throughout the bilateral cerebral white matter. Remote appearing infarct in the left lateral lenticulostriate distribution with left caudate head volume loss and anterior limb internal capsule low-density. Unusually focal and prominent calcification near the left MCA bifurcation, stable from face CT in 2009.  Critical Value/emergent results were called by telephone at the time of interpretation on 05/14/2015 at 10:08 am to Dr. Thad Rangereynolds, who verbally acknowledged these results.  IMPRESSION: 1. No intracranial hemorrhage or visible acute infarct. 2. Chronic small vessel disease and remote left lateral lenticulostriate distribution infarction. 3. Calcification near the distal left M1 segment could reflect prominent atherosclerotic plaque, aneurysm, or remote dissection.   Electronically Signed   By: Marnee SpringJonathon  Watts M.D.   On: 05/14/2015 10:13     EKG Interpretation None      MDM   Final diagnoses:  Altered level of consciousness     64 year old female with history of prior cardiac transplant and prior MRSA bacteremia presents with onset of confusion this morning after getting ready to go to a wound care appointment. Her husband who accompanies her states that this is a very similar presentation to her prior episode of bacteremia which resulted from an indwelling PICC line. She has no indwelling hardware currently. Due to the sudden nature and onset of symptoms with speech difficulty a code stroke was initiated on arrival. There is no acute abnormality on CT scan.  The patient had improvement in her symptoms over the course of her ED stay. She is now awake and alert but still has some persistent slurring of words. No infectious source noted on chest x-ray, no evidence of acute abnormalities on metabolic panel or CBC.  Her transplant team [facility] these acute symptoms.   After discussion with the transplant coordinator and neurology recognition was made to complete the workup for TIA as the patient has resolved. Her transplant team will see her in the office in the next few days to follow-up on results of drawn labs. The patient and her family  do not wish to be transferred to [facility] at this time despite the availability of records there. The patient will be admitted here for TIA workup.   Lyndal Pulleyaniel Skilynn Durney, MD 05/14/15 1521  Purvis SheffieldForrest Harrison, MD 05/14/15 [phone]

## 2015-05-14 NOTE — ED Notes (Signed)
Admitting at bedside 

## 2015-05-14 NOTE — ED Notes (Signed)
Patient transported to X-ray 

## 2015-05-14 NOTE — ED Notes (Signed)
[facility]

## 2015-05-14 NOTE — ED Provider Notes (Signed)
The hospitalist Dr. Gwenlyn PerkingMadera has talked with the patient in the family. At this point [facility] service for further evaluation. I spoke with Melissa use the transplant coordinator and Dr. Allena KatzPatel has accepted the patient. We are awaiting a bed assignment.  Rolan BuccoMelanie Estephanie Hubbs, MD 05/14/15 [phone]

## 2015-05-14 NOTE — ED Notes (Signed)
Per EMS: pt going to wound care appointment and then pt became confused and had right sided facial droop; pt with aphasia

## 2015-05-14 NOTE — Code Documentation (Signed)
64[facility] [id number].  EMS reports that the patient presented to a local wound care clinic this morning with right sided weakness and slurred speech.  EMS was called and activated a code stroke.  Stroke team to the bedside on arrival.  Labs drawn and patient to CT.  NIHSS 11, see documentation for details and code stroke times.  Patient with a h/o heart transplant, right knee infection and sepsis.  Patient's husband to the bedside reporting that the patient woke up at her baseline at 0530, stopped and ate a biscuit and was appropriate during the car ride to the clinic until around 0730 when he noticed her to start slurring her words.  He also reports that the patient does experience intermittent confusion at times and had this similar presentation when she became septic following her heart transplant.  Patient with confusion and aphasia which improved.  Patient with continued right facial droop and dysarthria.  Dr. Thad Rangereynolds to the bedside.  Patient is too mild to treat at this time with tPA but remains in the window to treat with tPA until 1130 should symptoms worsen.  Bedside handoff with ED RN Shanda BumpsJessica.

## 2015-05-15 LAB — TACROLIMUS LEVEL: Tacrolimus (FK506) - LabCorp: 9.6 ng/mL (ref 2.0–20.0)

## 2015-05-16 LAB — URINE CULTURE: Colony Count: 60000

## 2015-05-20 LAB — CULTURE, BLOOD (ROUTINE X 2)
CULTURE: NO GROWTH
Culture: NO GROWTH

## 2015-06-23 IMAGING — CR DG CHEST 2V
2 series · 2 of 2 positions shown · non-contrast
Comparison: 02/02/2014

CLINICAL DATA: Confusion, RIGHT facial droop, aphasia, history
coronary artery disease, hypertension, diabetes, heart transplant

EXAM:
CHEST  2 VIEW

[w chest lat]
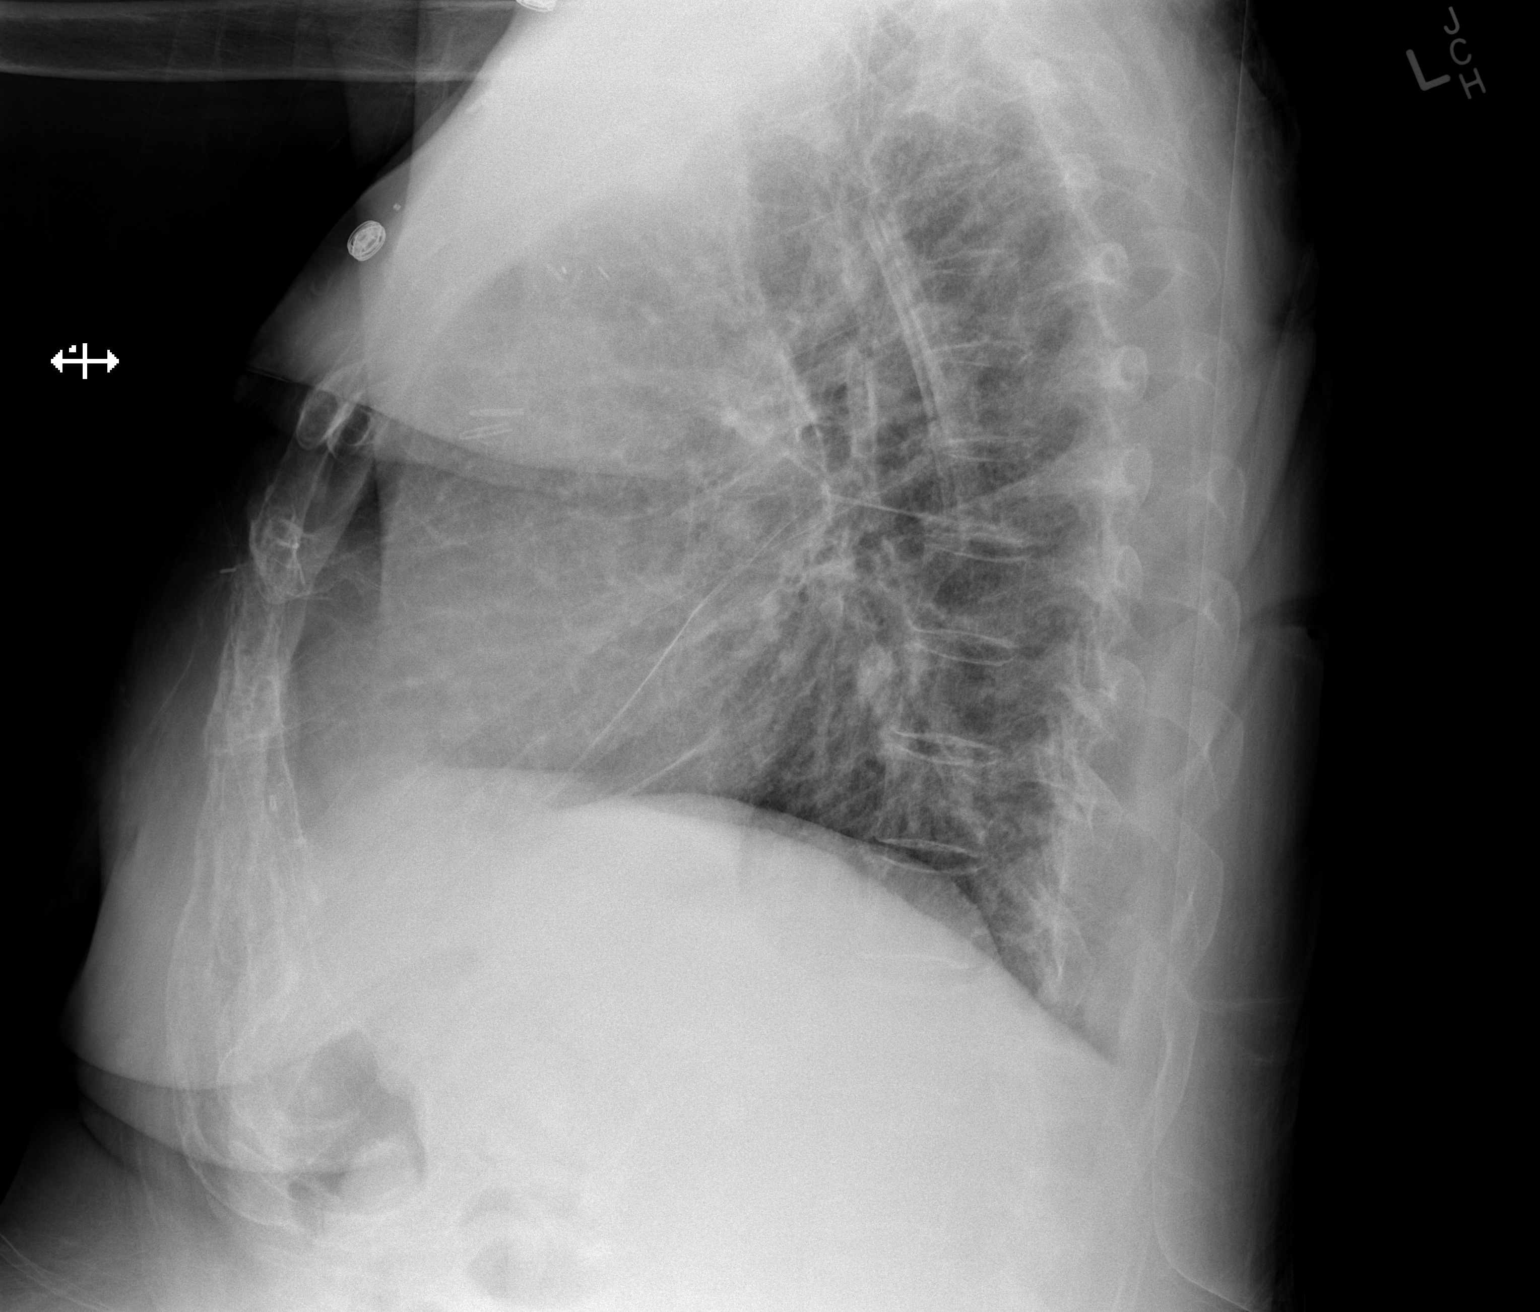

[x chest ap]
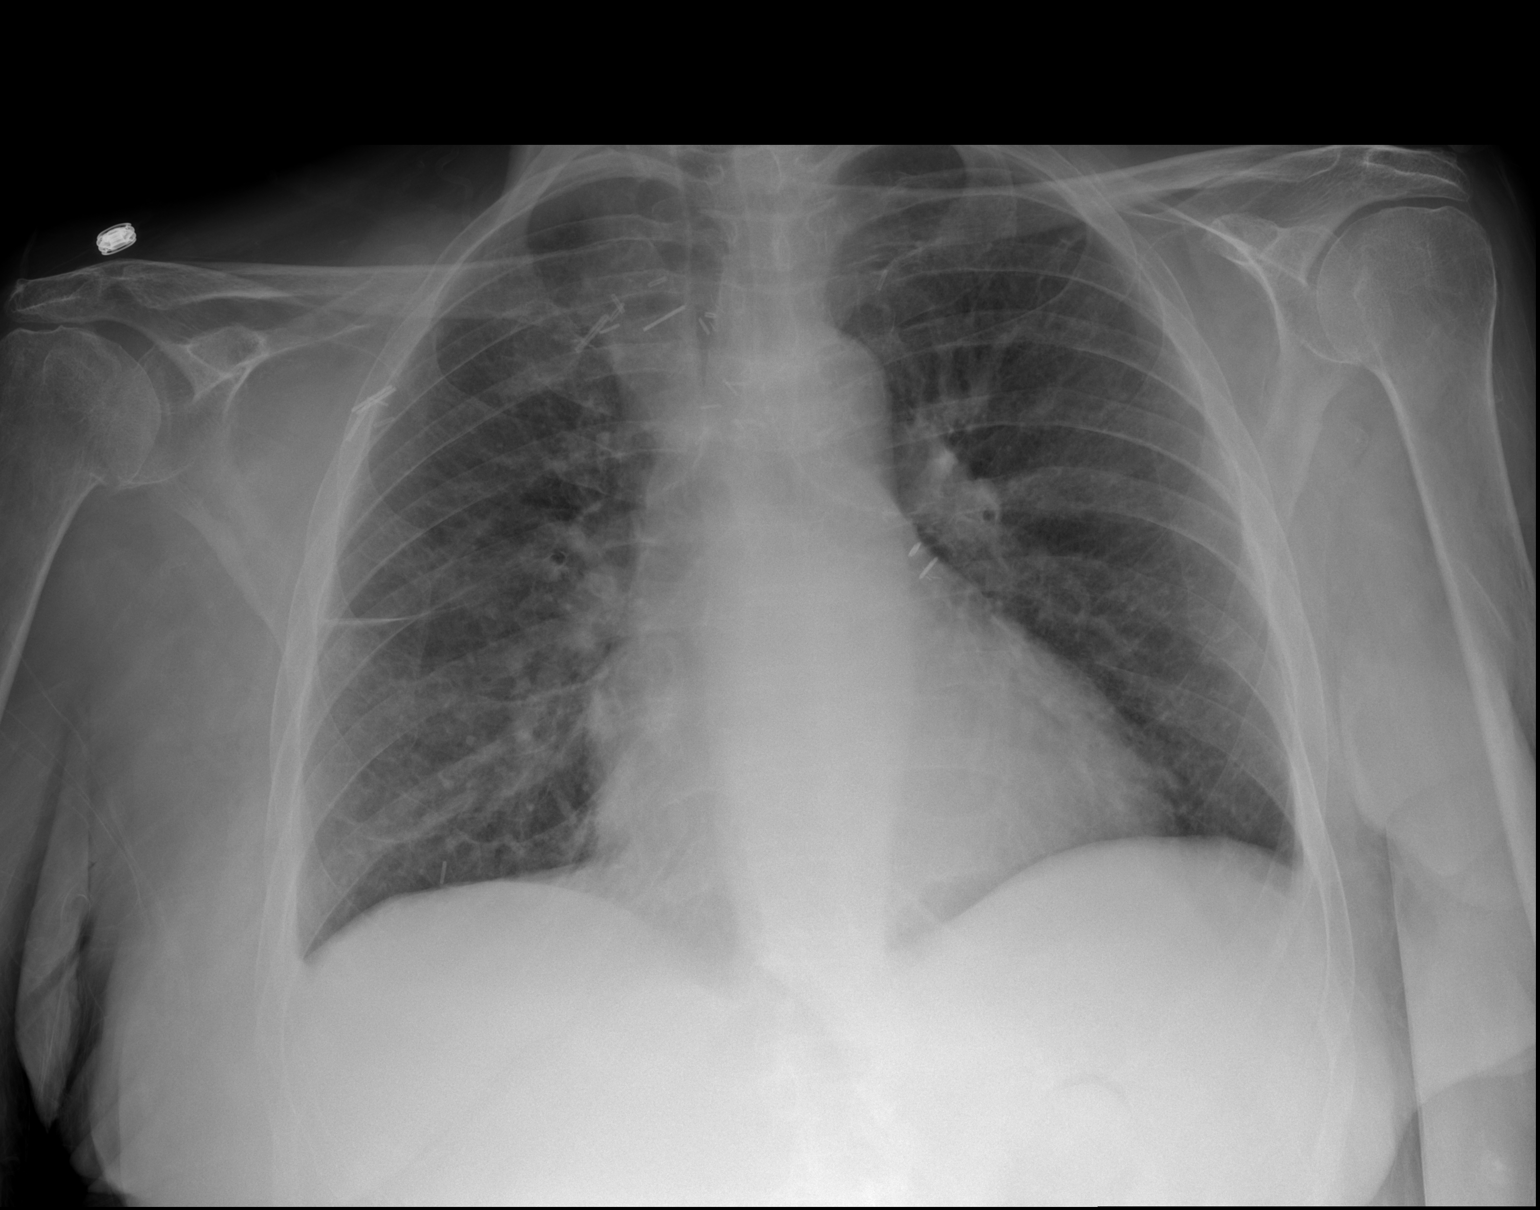

[2 of 2 positions shown; findings below may reference images not displayed]

FINDINGS: Upper normal size of cardiac silhouette post median sternotomy and
by history heart transplant.

Mediastinal contours and pulmonary vascularity normal.

Peribronchial thickening without infiltrate, pleural effusion or
pneumothorax.

Bones demineralized.
IMPRESSION: No acute abnormalities.

## 2015-07-23 ENCOUNTER — Encounter (HOSPITAL_BASED_OUTPATIENT_CLINIC_OR_DEPARTMENT_OTHER): Payer: BC Managed Care – PPO | Attending: Plastic Surgery

## 2015-07-23 DIAGNOSIS — Z941 Heart transplant status: Secondary | ICD-10-CM | POA: Insufficient documentation

## 2015-07-23 DIAGNOSIS — I252 Old myocardial infarction: Secondary | ICD-10-CM | POA: Diagnosis not present

## 2015-07-23 DIAGNOSIS — Z794 Long term (current) use of insulin: Secondary | ICD-10-CM | POA: Insufficient documentation

## 2015-07-23 DIAGNOSIS — E669 Obesity, unspecified: Secondary | ICD-10-CM | POA: Insufficient documentation

## 2015-07-23 DIAGNOSIS — M109 Gout, unspecified: Secondary | ICD-10-CM | POA: Insufficient documentation

## 2015-07-23 DIAGNOSIS — I10 Essential (primary) hypertension: Secondary | ICD-10-CM | POA: Insufficient documentation

## 2015-07-23 DIAGNOSIS — L97919 Non-pressure chronic ulcer of unspecified part of right lower leg with unspecified severity: Secondary | ICD-10-CM | POA: Insufficient documentation

## 2015-07-23 DIAGNOSIS — F0390 Unspecified dementia without behavioral disturbance: Secondary | ICD-10-CM | POA: Insufficient documentation

## 2015-07-23 DIAGNOSIS — D649 Anemia, unspecified: Secondary | ICD-10-CM | POA: Diagnosis not present

## 2015-07-23 DIAGNOSIS — M869 Osteomyelitis, unspecified: Secondary | ICD-10-CM | POA: Insufficient documentation

## 2015-07-23 DIAGNOSIS — E1140 Type 2 diabetes mellitus with diabetic neuropathy, unspecified: Secondary | ICD-10-CM | POA: Insufficient documentation

## 2015-08-05 ENCOUNTER — Other Ambulatory Visit: Payer: Self-pay | Admitting: Plastic Surgery

## 2015-08-05 DIAGNOSIS — L97812 Non-pressure chronic ulcer of other part of right lower leg with fat layer exposed: Secondary | ICD-10-CM

## 2015-08-09 ENCOUNTER — Inpatient Hospital Stay (HOSPITAL_COMMUNITY)
Admission: RE | Admit: 2015-08-09 | Discharge: 2015-08-09 | Disposition: A | Discharge status: Home / Self Care | Payer: BC Managed Care – PPO | Source: Ambulatory Visit

## 2015-08-09 NOTE — Progress Notes (Addendum)
Patient sees Cardiology at [facility] where she had her heart transplant. All tests are in Care Everywhere under '[facility]' and 'Other Results'.  Echo: 05/15/15  Cath: 05/16/2015  Stress: 08/07/2014  EKG: 05/14/15  ECG 12-lead (05/14/2015 10:07 PM)  Component Value Range  Vent Rate (bpm) 79   PR Interval (msec) 144   QRS Interval (msec) 132   QT Interval (msec) 478   QTc (msec) 548    ECG 12-lead (05/14/2015 10:07 PM)  Narrative  Normal sinus rhythm  Right bundle branch block  Left posterior fascicular block  Left atrial enlargement  Nonspecific T wave abnormalities, diffuse leads  Prolonged QT  Abnormal ECG    When compared with ECG of 19-Mar-2015 11:17,  Prolonged QT is now present  Left atrial enlargement is now present  I reviewed and concur with this report. Electronically signed ZO:XWRUE, MD, AUGUSTUS ([id number]) on 05/15/2015 1:41:29 PM

## 2015-08-09 NOTE — Progress Notes (Signed)
Spoke with patient's husband, Mr. Schreffler, at 14.  He states that they were told they did not have to come in for PAT appointment by Dr. Leonie Green office, that all necessary lab work would be faxed there from [facility] and then forwarded to PAT.  Will call Dr. Leonie Green office to follow-up.

## 2015-08-09 NOTE — Pre-Procedure Instructions (Signed)
Shelby Lyons  08/09/2015      [facility] [address], [place] - [address] [place] [address] Phone: [phone] Fax: [phone]  [facility] [address] - [address] [place] - [address] AT [facility] [address] [phone] [place] [address] Phone: [phone] Fax: [phone]    Your procedure is scheduled on Wednesday September 7th 2016.  Report to [facility] Admitting at 1030 A.M.  Call this number if you have problems the morning of surgery:  [phone]   Call this number if you have problems in the days leading up to your surgery:  [phone]    Remember:  Do not eat food or drink liquids after midnight Tuesday September 6th 2016.  Take these medicines the morning of surgery with A SIP OF WATER Buspirone (Buspar), Fluconazole (Diflucan), Gabapentin (Neurontin), Hydralazine (Apresoline), Levothyroxine (Synthroid/Levothroid), Minocycline (Minocin, Dynacin), Mirtazapine (Remeron), Mycophenolate (Cellcept), Pantoprazole (Protonix), Prednisone (Deltasone), Ropinirole (Requip), Sertraline (Zoloft), Tacrolimus (Prograf).  Use inhaler (albuterol) if needed and bring with you.   STOP: ALL Vitamins, Supplements, Effient and Herbal Medications, Fish Oils, Aspirins, NSAIDs (Nonsteroidal Anti-inflammatories such as Ibuprofen, Aleve, or Advil), and Goody's/BC Powders 7 days prior to surgery, until after surgery as directed by your physician. This includes Aspirin.  Follow your surgeon or cardiologist's instructions for stopping your Plavix.      How to Manage Your Diabetes Before Surgery   Why is it important to control my blood sugar before and after surgery?   Improving blood sugar levels before and after surgery helps healing and can limit problems.  A way of improving blood sugar control is eating a healthy diet by:  - Eating less sugar and carbohydrates  - Increasing activity/exercise  - Talk with your doctor about reaching your  blood sugar goals  High blood sugars (greater than 180 mg/dL) can raise your risk of infections and slow down your recovery so you will need to focus on controlling your diabetes during the weeks before surgery.  Make sure that the doctor who takes care of your diabetes knows about your planned surgery including the date and location.  How do I manage my blood sugars before surgery?   Check your blood sugar at least 4 times a day, 2 days before surgery to make sure that they are not too high or low.   Check your blood sugar the morning of your surgery when you wake up and every 2 hours until you get to the Short-Stay unit.  If your blood sugar is less than 70 mg/dL, you will need to treat for low blood sugar by:  Treat a low blood sugar (less than 70 mg/dL) with 1/2 cup of clear juice (cranberry or apple), 4 glucose tablets, OR glucose gel.  Recheck blood sugar in 15 minutes after treatment (to make sure it is greater than 70 mg/dL).  If blood sugar is not greater than 70 mg/dL on re-check, call [phone] for further instructions.   Report your blood sugar to the Short-Stay nurse when you get to Short-Stay.  References:  [facility], 2007 "How to Manage your Diabetes Before and After Surgery".  What do I do about my diabetes medications?   Do not take oral diabetes medicines (pills) the morning of surgery.  THE NIGHT BEFORE SURGERY, take 24 units of Lantus Insulin.    THE MORNING OF SURGERY, take 15 units of Lantus Insulin.    Do  not take other diabetes injectables the day of surgery including Byetta, Victoza, Bydureon, and Trulicity.    If your CBG is greater than 220 mg/dL, you may take 1/2 of your sliding scale (correction) dose of insulin.     Do not wear jewelry, make-up or nail polish.  Do not wear lotions, powders, or perfumes.  You may wear deodorant.  Do not shave 48 hours prior to surgery.  Men may shave face and neck.  Do not  bring valuables to the hospital.  [facility] is not responsible for any belongings or valuables.  Contacts, dentures or bridgework may not be worn into surgery.  Leave your suitcase in the car.  After surgery it may be brought to your room.  For patients admitted to the hospital, discharge time will be determined by your treatment team.  Patients discharged the day of surgery will not be allowed to drive home.   Special instructions:  Please follow these instructions carefully:  1. Shower with CHG Soap the night before surgery and the morning of Surgery. 2. If you choose to wash your hair, wash your hair first as usual with your normal shampoo. 3. After you shampoo, rinse your hair and body thoroughly to remove the Shampoo. 4. Use CHG as you would any other liquid soap. You can apply chg directly to the skin and wash gently with scrungie or a clean washcloth. 5. Apply the CHG Soap to your body ONLY FROM THE NECK DOWN. Do not use on open wounds or open sores. Avoid contact with your eyes, ears, mouth and genitals (private parts). Wash genitals (private parts) with your normal soap. 6. Wash thoroughly, paying special attention to the area where your surgery will be performed. 7. Thoroughly rinse your body with warm water from the neck down. 8. DO NOT shower/wash with your normal soap after using and rinsing off the CHG Soap. 9. Pat yourself dry with a clean towel.  10. Wear clean pajamas.  11. Place clean sheets on your bed the night of your first shower and do not sleep with pets.  Day of Surgery  Do not apply any lotions/deodorants the morning of surgery. Please wear clean clothes to the hospital/surgery center.    Please read over the following fact sheets that you were given. Pain Booklet, Coughing and Deep Breathing, MRSA Information and  Surgical Site Infection Prevention

## 2015-08-09 NOTE — Progress Notes (Addendum)
Spoke with Eunice Blase at Dr. Leonie Green office.  She said she would fax whatever they receive from [facility] to PAT.  Will forward chart for Anesthesia review pending lab results and let Gail/Ericka know.  Patient still needs to receive DOS instructions...pending PAT appointment/necessary lab work.

## 2015-08-10 ENCOUNTER — Encounter (HOSPITAL_COMMUNITY)
Admission: RE | Admit: 2015-08-10 | Discharge: 2015-08-10 | Disposition: A | Discharge status: Home / Self Care | Payer: BC Managed Care – PPO | Source: Ambulatory Visit | Attending: Plastic Surgery | Admitting: Plastic Surgery

## 2015-08-10 ENCOUNTER — Encounter (HOSPITAL_COMMUNITY): Payer: Self-pay

## 2015-08-10 DIAGNOSIS — Z01812 Encounter for preprocedural laboratory examination: Secondary | ICD-10-CM | POA: Insufficient documentation

## 2015-08-10 HISTORY — DX: Hypothyroidism, unspecified: E03.9

## 2015-08-10 HISTORY — DX: Chronic obstructive pulmonary disease, unspecified: J44.9

## 2015-08-10 HISTORY — DX: Major depressive disorder, single episode, unspecified: F32.9

## 2015-08-10 HISTORY — DX: Type 2 diabetes mellitus with unspecified diabetic retinopathy without macular edema: E11.319

## 2015-08-10 HISTORY — DX: Pneumonia, unspecified organism: J18.9

## 2015-08-10 HISTORY — DX: Calculus of kidney: N20.0

## 2015-08-10 HISTORY — DX: Polyneuropathy, unspecified: G62.9

## 2015-08-10 HISTORY — DX: Personal history of other diseases of the digestive system: Z87.19

## 2015-08-10 HISTORY — DX: Irritable bowel syndrome, unspecified: K58.9

## 2015-08-10 HISTORY — DX: Depression, unspecified: F32.A

## 2015-08-10 HISTORY — DX: Unspecified osteoarthritis, unspecified site: M19.90

## 2015-08-10 LAB — GLUCOSE, CAPILLARY: GLUCOSE-CAPILLARY: 383 mg/dL — AB (ref 65–99)

## 2015-08-10 NOTE — Progress Notes (Addendum)
Anesthesia PAT Evaluation: Patient is a 64 year old female with known history of heart transplant scheduled for debridement of right knee wound with placement of Acell on 08/15/15 by Dr. Kelly Splinter. DX: right knee ulcer. She was a no show at PAT on 08/09/15, but saw Dr. Kelly Splinter today and sent over for a PAT visit with anesthesia consult. Dr. Leonie Green office also faxed labs that were sent to them by [facility].    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 Patient lives in [place]. She is here today with her sister.   PCP is Krystal Clark, ACNP-C and Dr. Feliciana Rossetti with [facility]. Their office is now closed for the Labor Day holiday weekend. She has pending endocrinology new patient appointment with Dr. Romero Belling scheduled for 08/22/15.   Patient did not have list with her today. Pharmacy was to get list from Dr. Kelly Splinter and also contact her pharmacy for medications filled in the last three months. Meds listed currently include albuterol, ASA 81 mg, Symbicort, buspirone, Plavix, Diflucan, Flonase, Neurontin, hydralazine, Novolog Flexpen, Lantus, levothyroxine, minocycline, Remeron, Cellcept, Protonix, pravastatin, prednisone, Requip, Prograf, Zoloft. Patient is taking her Lantus differently than what was originally obtained from her pharmacist (only taking 25 units Q AM, instead of 30 units BID).  She can't remember who or if someone had decreased her Lantus insulin back to once a day. On her own, she is planning to continue Lantus 25 Units in the morning and restart a PM Lantus dose of 10-15 Units until she can follow-up with her PCP office on Tuesday. While I feel like she can tolerate a low evening Lantus dose, I  advised further discussion with her PCP and frequent monitoring of her glucose levels, and advised against making any further adjustments without input from one of her medical doctors.   PAT Vitals: T 36.8, HR 78, R 20, BP 129/65, O2 sat 100%, CBG 383.   05/14/15 EKG: SR, right BBB, LPFB.  The following records can be viewed in Care Everywhere: * Last [facility] cardiac transplant visit was on 08/08/15 with Dr. Leeann Must (fellow)/Dr. Cheral Bay. Problem list below:  1. 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 Echocardiogram on May 15, 2015 with mildly impaired LVEF and mild to moderate TR B. Stress TTE not done C. Coronary angiogram on May 16, 2015 with no obstructive lesions D. EM biopsy on: May 16, 2015 with 1R/1A  Significant rejection history: 1R/2 on June 27, 2014 and July 11, 2014 2. 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 Hypertension 4. Chronic kidney disease  * 08/08/15 Endomyocardial biopsy surveillance for allograft rejection. Results pending.   * 08/08/15 Limited Echo: INTERPRETATION --------------------------------------------------------------- NORMAL LEFT VENTRICULAR FUNCTION WITH MODERATE LVH (closest estimated EF > 55%) NORMAL RIGHT VENTRICULAR SYSTOLIC FUNCTION VALVULAR REGURGITATION: MILD MR, TRIVIAL PR, MILD TR NO VALVULAR STENOSIS LIMITED ECHO TO ASSESS RV BIOPSY  Compared with prior Echo study on 05/15/2015: IMPROVED LV FUNCTION  * 05/16/15 Catheterization:  Right Heart Catheterization State: Baseline RA: 5 mmHg  (mean) RV: 31/ 2 mmHg PA: 32/ 13 22 mmHg (mean) PCW: 7 mmHg (mean) AV O2: 4.1 vol% Cardiac output: 5.0 L/min Cardiac index: 2.7 L/min-m2 PVR: 3.0 Wood units Shunt Ratio: 1.0 (Qp/Qs) L to R shunt: 0.0 L/min (Qp-Qep) R to L shunt: 0.0 L/min (Qs-Qep) All coronaries normal (no CAD). Right dominance. Impressions 1. Normal cardiac index at rest with normal filling pressures. 2. Mild increase in PA pressure, PVR 3 Wu 3. Normal coronary arteriograms 4. RV septal biopsy X 3 without incident  * 05/15/15 MRI/MRA brain/neck: IMPRESSION:  1. No evidence of acute ischemia. 2. 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 3. Atherosclerotic narrowing of the origin of the right vertebral artery redemonstrated. 4. Remote lacunar infarct involving the left corona radiata. Small remote infarct involving the right occipital lobe.  * 08/08/15 Labs show H/H 9.8/32.8 (hgb previously 9.7-10.8 in 05/2015). PLT 491K. Mg low at 1.7. Na 130, Cl 95, Cr 1.7 (previously 1.3 06/04/15), glucose 354 (A1C 11.4 on 08/08/15 consistent with average glucose of 295. This was up from 9.8 on 06/04/15.).  Earlier today I left a voice message with Dr. Leonie Green nurse line this morning regarding abnormal lab results including glucose and A1C and also asking them to contact patient if they had not already provided instructions regarding her perioperative Plavix/ASA. I have not heard back from Dr. Leonie Green office. Patient said Dr. Kelly Splinter reviewed labs from [facility] while at her office this morning. She is also contacting Delice Bison, [facility]iac Transplant coordinator, for recommendations on perioperative ASA/Plavix instructions and is suppose to let the patient know. I advised they follow-up with Dr. Leonie Green office if they do not hear back. Our PAT RN Rosey Bath is also attempting to get input from Dr. Kelly Splinter.  Exam shows a pleasant Caucasian female in NAD. She is in a wheelchair (can't walk due to right knee). Heart RRR, no murmur  noted. Lungs clear. Trace left pedal edema. 1-2+ right pedal edema. Patient feels stable from a cardiopulmonary standpoint. No chest pain, SOB, fever, syncope, palpitations. She sleeps on one pillow. She is getting right knee dressing changes at home and has chronic RLE edema felt related to this.   I did discuss heart transplant history and poorly controlled DM with anesthesiologist Dr. Michelle Piper. She reported fasting glucose levels 200-300. She has a right knee wound and is on prednisone for her anti-rejections--both of which are likely contributing to her hyperglycemia. Unfortunately, her PCP office is now closed, but she was advised to follow-up with his office for additional insulin adjustments prior to surgery. Dr. Michelle Piper recommended having her arrive three hours before her scheduled surgery time in hopes to get her glucose better controlled (consider insulin IV drip) prior to her surgery.   Velna Ochs [facility] Short Stay Center/Anesthesiology Phone [phone] 08/10/2015 4:07 PM

## 2015-08-10 NOTE — Pre-Procedure Instructions (Signed)
[place] Holian [place]  08/10/2015      Your procedure is scheduled on Wednesday, August 15, 2015 at 12:30 PM.   Report to [facility] Entrance "A" Admitting Office at 9:30 AM.   Call this number if you have problems the morning of surgery: [phone]    Remember:  Do not eat food or drink liquids after midnight Tuesday, 08/14/15.  Take these medicines the morning of surgery with A SIP OF WATER: Buspirone (Buspar), Gabapentin (Neurontin), Hydralazine (Apresoline), Levothyroxine (Synthroid), Mycophenolate (Cellcept), Pantoprazole (Protonix), Prednisone, Tacrolimus (Prograf), Sertraline (Zoloft), Symbicort inhaler, Tramadol - if needed, Flonase - if needed, Albuterol inhaler - if needed.  Stop Vitamins as of today.   Follow instructions from Dr. Kelly Splinter about your Aspirin and Plavix.  How to Manage Your Diabetes Before Surgery   Why is it important to control my blood sugar before and after surgery?   Improving blood sugar levels before and after surgery helps healing and can limit problems.  A way of improving blood sugar control is eating a healthy diet by:  - Eating less sugar and carbohydrates  - Increasing activity/exercise  - Talk with your doctor about reaching your blood sugar goals  High blood sugars (greater than 180 mg/dL) can raise your risk of infections and slow down your recovery so you will need to focus on controlling your diabetes during the weeks before surgery.  Make sure that the doctor who takes care of your diabetes knows about your planned surgery including the date and location.  How do I manage my blood sugars before surgery?   Check your blood sugar at least 4 times a day, 2 days before surgery to make sure that they are not too high or low.    Check your blood sugar the morning of your surgery when you wake up and every 2 hours until you get to the Short-Stay unit.   If your blood sugar is less than 70 mg/dL, you will need to treat for low  blood sugar by:   Treat a low blood sugar (less than 70 mg/dL) with 1/2 cup of clear juice (cranberry or apple), 4 glucose tablets, OR glucose gel.   Recheck blood sugar in 15 minutes after treatment (to make sure it is greater than 70 mg/dL).  If blood sugar is not greater than 70 mg/dL on re-check, call [phone] for further instructions.    Report your blood sugar to the Short-Stay nurse when you get to Short-Stay.  References:  [facility], 2007 "How to Manage your Diabetes Before and After Surgery".  What do I do about my diabetes medications?   THE DAY BEFORE SURGERY, take regular dose of Lantus Insulin and take regular doses of Novolog at meals. Do not take the bed time dose of Novolog.     THE MORNING OF SURGERY, take 12 units of Lantus Insulin   If your CBG is greater than 220 mg/dL, you may take 1/2 of your sliding scale (correction) dose of insulin (Novolog).   Do not wear jewelry, make-up or nail polish.  Do not wear lotions, powders, or perfumes.  You may wear deodorant.  Do not shave 48 hours prior to surgery.  Men may shave face and neck.  Do not bring valuables to the hospital.  [facility] is not responsible for any belongings or valuables.  Contacts, dentures or bridgework may not be worn into surgery.  Leave your suitcase in the car.  After surgery it  may be brought to your room.  For patients admitted to the hospital, discharge time will be determined by your treatment team.  Patients discharged the day of surgery will not be allowed to drive home.   Special instructions:  [place] - Preparing for Surgery  Before surgery, you can play an important role.  Because skin is not sterile, your skin needs to be as free of germs as possible.  You can reduce the number of germs on you skin by washing with CHG (chlorahexidine gluconate) soap before surgery.  CHG is an antiseptic cleaner which kills germs and bonds with the skin to  continue killing germs even after washing.  Please DO NOT use if you have an allergy to CHG or antibacterial soaps.  If your skin becomes reddened/irritated stop using the CHG and inform your nurse when you arrive at Short Stay.  Do not shave (including legs and underarms) for at least 48 hours prior to the first CHG shower.  You may shave your face.  Please follow these instructions carefully:   1.  Shower with CHG Soap the night before surgery and the                                morning of Surgery.  2.  If you choose to wash your hair, wash your hair first as usual with your       normal shampoo.  3.  After you shampoo, rinse your hair and body thoroughly to remove the                      Shampoo.  4.  Use CHG as you would any other liquid soap.  You can apply chg directly       to the skin and wash gently with scrungie or a clean washcloth.  5.  Apply the CHG Soap to your body ONLY FROM THE NECK DOWN.        Do not use on open wounds or open sores.  Avoid contact with your eyes, ears, mouth and genitals (private parts).  Wash genitals (private parts) with your normal soap.  6.  Wash thoroughly, paying special attention to the area where your surgery        will be performed.  7.  Thoroughly rinse your body with warm water from the neck down.  8.  DO NOT shower/wash with your normal soap after using and rinsing off       the CHG Soap.  9.  Pat yourself dry with a clean towel.            10.  Wear clean pajamas.            11.  Place clean sheets on your bed the night of your first shower and do not        sleep with pets.  Day of Surgery  Do not apply any lotions the morning of surgery.  Please wear clean clothes to the hospital.    Please read over the following fact sheets that you were given. Pain Booklet, Coughing and Deep Breathing and Surgical Site Infection Prevention

## 2015-08-10 NOTE — Progress Notes (Signed)
Pt's CBG was 383 today at PAT. States that she ate at 9:45 AM today, has not taken her lunch time dose of Novolog. She states that her fasting blood sugars vary from 200 - 300 depending on how much insulin she took the night before. She states she takes 25 units of Lantus in the AM and takes 15 units of Novolog with meals and Novolog 4 units at bedtime or she might take more depending on her blood sugar. This was not how it was listed on her medication list that the pharmacy tech had gotten from patient's pharmacy. She states that she used to take Lantus twice a day but sometime at [facility] it was changed to once a day. She states her PCP, Theo Dills, PA is aware of how she takes it. Her most recent A1c was 11.4 on 08/08/15. She and her sister state that she has an upcoming appointment with an Endocrinologist because her PCP was unhappy with her blood sugar levels.  Pt had all lab work done on 08/08/15 at [facility] and results are in Care Everywhere.   Pt denies any heart problems since her heart transplant last summer. Is followed closely at [facility].   Tried several times and left messages at Dr. Leonie Green office to find out if pt needed to stop Aspirin and Plavix. Pt states she was not instructed by Dr. Kelly Splinter while she was there. Pt and sister state they will try to contact Dr. Kelly Splinter also.

## 2015-08-10 NOTE — Pre-Procedure Instructions (Signed)
Shelby Lyons  08/10/2015      Your procedure is scheduled on Wednesday, August 15, 2015 at 12:30 PM.   Report to [facility] Entrance "A" Admitting Office at 10:30 AM.   Call this number if you have problems the morning of surgery: [phone]    Remember:  Do not eat food or drink liquids after midnight Tuesday, 08/14/15.  Take these medicines the morning of surgery with A SIP OF WATER: Buspirone (Buspar), Gabapentin (Neurontin), Hydralazine (Apresoline), Levothyroxine (Synthroid), Mirtazapine (Remeron), Mycophenolate (Cellcept), Pantoprazole (Protonix), Prednisone, Tacrolimus (Prograf), Sertraline (Zoloft)  Follow instructions from Dr. Kelly Splinter about your Aspirin and Plavix.  How to Manage Your Diabetes Before Surgery   Why is it important to control my blood sugar before and after surgery?   Improving blood sugar levels before and after surgery helps healing and can limit problems.  A way of improving blood sugar control is eating a healthy diet by:  - Eating less sugar and carbohydrates  - Increasing activity/exercise  - Talk with your doctor about reaching your blood sugar goals  High blood sugars (greater than 180 mg/dL) can raise your risk of infections and slow down your recovery so you will need to focus on controlling your diabetes during the weeks before surgery.  Make sure that the doctor who takes care of your diabetes knows about your planned surgery including the date and location.  How do I manage my blood sugars before surgery?   Check your blood sugar at least 4 times a day, 2 days before surgery to make sure that they are not too high or low.    Check your blood sugar the morning of your surgery when you wake up and every 2 hours until you get to the Short-Stay unit.   If your blood sugar is less than 70 mg/dL, you will need to treat for low blood sugar by:   Treat a low blood sugar (less than 70 mg/dL) with 1/2 cup of clear juice  (cranberry or apple), 4 glucose tablets, OR glucose gel.   Recheck blood sugar in 15 minutes after treatment (to make sure it is greater than 70 mg/dL).  If blood sugar is not greater than 70 mg/dL on re-check, call [phone] for further instructions.    Report your blood sugar to the Short-Stay nurse when you get to Short-Stay.  References:  [facility], 2007 "How to Manage your Diabetes Before and After Surgery".  What do I do about my diabetes medications?   THE NIGHT BEFORE SURGERY, take 24 units of Lantus Insulin.    THE MORNING OF SURGERY, take 15 units of Lantus Insulin.   If your CBG is greater than 220 mg/dL, you may take 1/2 of your sliding scale (correction) dose of insulin.   Do not wear jewelry, make-up or nail polish.  Do not wear lotions, powders, or perfumes.  You may wear deodorant.  Do not shave 48 hours prior to surgery.  Men may shave face and neck.  Do not bring valuables to the hospital.  [facility] is not responsible for any belongings or valuables.  Contacts, dentures or bridgework may not be worn into surgery.  Leave your suitcase in the car.  After surgery it may be brought to your room.  For patients admitted to the hospital, discharge time will be determined by your treatment team.  Patients discharged the day of surgery will not be allowed to drive home.   Special  instructions:  [place] - Preparing for Surgery  Before surgery, you can play an important role.  Because skin is not sterile, your skin needs to be as free of germs as possible.  You can reduce the number of germs on you skin by washing with CHG (chlorahexidine gluconate) soap before surgery.  CHG is an antiseptic cleaner which kills germs and bonds with the skin to continue killing germs even after washing.  Please DO NOT use if you have an allergy to CHG or antibacterial soaps.  If your skin becomes reddened/irritated stop using the CHG and inform your  nurse when you arrive at Short Stay.  Do not shave (including legs and underarms) for at least 48 hours prior to the first CHG shower.  You may shave your face.  Please follow these instructions carefully:   1.  Shower with CHG Soap the night before surgery and the                                morning of Surgery.  2.  If you choose to wash your hair, wash your hair first as usual with your       normal shampoo.  3.  After you shampoo, rinse your hair and body thoroughly to remove the                      Shampoo.  4.  Use CHG as you would any other liquid soap.  You can apply chg directly       to the skin and wash gently with scrungie or a clean washcloth.  5.  Apply the CHG Soap to your body ONLY FROM THE NECK DOWN.        Do not use on open wounds or open sores.  Avoid contact with your eyes, ears, mouth and genitals (private parts).  Wash genitals (private parts) with your normal soap.  6.  Wash thoroughly, paying special attention to the area where your surgery        will be performed.  7.  Thoroughly rinse your body with warm water from the neck down.  8.  DO NOT shower/wash with your normal soap after using and rinsing off       the CHG Soap.  9.  Pat yourself dry with a clean towel.            10.  Wear clean pajamas.            11.  Place clean sheets on your bed the night of your first shower and do not        sleep with pets.  Day of Surgery  Do not apply any lotions the morning of surgery.  Please wear clean clothes to the hospital.    Please read over the following fact sheets that you were given. Pain Booklet, Coughing and Deep Breathing and Surgical Site Infection Prevention

## 2015-08-12 ENCOUNTER — Other Ambulatory Visit: Payer: Self-pay | Admitting: Plastic Surgery

## 2015-08-14 NOTE — Progress Notes (Signed)
Katie from Dr. Leonie Green office called today stating that she had received the messages that were left on Friday and she will be giving them all to Dr. Kelly Splinter.

## 2015-08-15 ENCOUNTER — Ambulatory Visit (HOSPITAL_COMMUNITY)
Admission: RE | Admit: 2015-08-15 | Discharge: 2015-08-15 | Disposition: A | Discharge status: Home / Self Care | Payer: BC Managed Care – PPO | Source: Ambulatory Visit | Attending: Plastic Surgery | Admitting: Plastic Surgery

## 2015-08-15 ENCOUNTER — Ambulatory Visit (HOSPITAL_COMMUNITY): Payer: BC Managed Care – PPO | Admitting: Vascular Surgery

## 2015-08-15 ENCOUNTER — Encounter (HOSPITAL_COMMUNITY): Payer: Self-pay | Admitting: Plastic Surgery

## 2015-08-15 ENCOUNTER — Encounter (HOSPITAL_COMMUNITY)
Admission: RE | Disposition: A | Discharge status: Home / Self Care | Payer: Self-pay | Source: Ambulatory Visit | Attending: Plastic Surgery

## 2015-08-15 DIAGNOSIS — I129 Hypertensive chronic kidney disease with stage 1 through stage 4 chronic kidney disease, or unspecified chronic kidney disease: Secondary | ICD-10-CM | POA: Insufficient documentation

## 2015-08-15 DIAGNOSIS — Z941 Heart transplant status: Secondary | ICD-10-CM | POA: Insufficient documentation

## 2015-08-15 DIAGNOSIS — I252 Old myocardial infarction: Secondary | ICD-10-CM | POA: Insufficient documentation

## 2015-08-15 DIAGNOSIS — N189 Chronic kidney disease, unspecified: Secondary | ICD-10-CM | POA: Diagnosis not present

## 2015-08-15 DIAGNOSIS — I2510 Atherosclerotic heart disease of native coronary artery without angina pectoris: Secondary | ICD-10-CM | POA: Insufficient documentation

## 2015-08-15 DIAGNOSIS — Z823 Family history of stroke: Secondary | ICD-10-CM | POA: Insufficient documentation

## 2015-08-15 DIAGNOSIS — L97819 Non-pressure chronic ulcer of other part of right lower leg with unspecified severity: Secondary | ICD-10-CM | POA: Diagnosis not present

## 2015-08-15 DIAGNOSIS — L97812 Non-pressure chronic ulcer of other part of right lower leg with fat layer exposed: Secondary | ICD-10-CM

## 2015-08-15 DIAGNOSIS — I5042 Chronic combined systolic (congestive) and diastolic (congestive) heart failure: Secondary | ICD-10-CM | POA: Diagnosis not present

## 2015-08-15 HISTORY — PX: APPLICATION OF A-CELL OF EXTREMITY: SHX6303

## 2015-08-15 HISTORY — PX: INCISION AND DRAINAGE OF WOUND: SHX1803

## 2015-08-15 LAB — GLUCOSE, CAPILLARY
GLUCOSE-CAPILLARY: 231 mg/dL — AB (ref 65–99)
GLUCOSE-CAPILLARY: 154 mg/dL — AB (ref 65–99)
GLUCOSE-CAPILLARY: 204 mg/dL — AB (ref 65–99)
Glucose-Capillary: 140 mg/dL — ABNORMAL HIGH (ref 65–99)

## 2015-08-15 SURGERY — IRRIGATION AND DEBRIDEMENT WOUND
Anesthesia: Monitor Anesthesia Care | Laterality: Right

## 2015-08-15 MED ORDER — MIDAZOLAM HCL 5 MG/5ML IJ SOLN
INTRAMUSCULAR | Status: DC | PRN
  Administered 2015-08-15: 2 mg via INTRAVENOUS

## 2015-08-15 MED ORDER — HYDROMORPHONE HCL 1 MG/ML IJ SOLN
0.2500 mg | INTRAMUSCULAR | Status: DC | PRN
  Administered 2015-08-15: 0.5 mg via INTRAVENOUS

## 2015-08-15 MED ORDER — LIDOCAINE-EPINEPHRINE 1 %-1:100000 IJ SOLN
INTRAMUSCULAR | Status: AC
  Filled 2015-08-15: qty 1

## 2015-08-15 MED ORDER — PROPOFOL 10 MG/ML IV BOLUS
INTRAVENOUS | Status: DC | PRN
  Administered 2015-08-15: 30 mg via INTRAVENOUS

## 2015-08-15 MED ORDER — FENTANYL CITRATE (PF) 100 MCG/2ML IJ SOLN
INTRAMUSCULAR | Status: AC
  Administered 2015-08-15: 50 ug
  Filled 2015-08-15: qty 2

## 2015-08-15 MED ORDER — MIDAZOLAM HCL 2 MG/2ML IJ SOLN
INTRAMUSCULAR | Status: AC
  Administered 2015-08-15: 1 mg
  Filled 2015-08-15: qty 2

## 2015-08-15 MED ORDER — LIDOCAINE-EPINEPHRINE 1 %-1:100000 IJ SOLN
INTRAMUSCULAR | Status: DC | PRN
  Administered 2015-08-15: 8 mL

## 2015-08-15 MED ORDER — MIDAZOLAM HCL 2 MG/2ML IJ SOLN
INTRAMUSCULAR | Status: AC
  Filled 2015-08-15: qty 4

## 2015-08-15 MED ORDER — PROPOFOL 10 MG/ML IV BOLUS
INTRAVENOUS | Status: AC
  Filled 2015-08-15: qty 20

## 2015-08-15 MED ORDER — CIPROFLOXACIN IN D5W 400 MG/200ML IV SOLN
400.0000 mg | INTRAVENOUS | Status: AC
  Administered 2015-08-15: 400 mg via INTRAVENOUS
  Filled 2015-08-15: qty 200

## 2015-08-15 MED ORDER — SODIUM CHLORIDE 0.9 % IR SOLN
Status: DC | PRN
  Administered 2015-08-15: 500 mL

## 2015-08-15 MED ORDER — CIPROFLOXACIN HCL 500 MG PO TABS: 500.0000 mg | ORAL_TABLET | Freq: Two times a day (BID) | ORAL | Status: AC

## 2015-08-15 MED ORDER — ONDANSETRON HCL 4 MG/2ML IJ SOLN
INTRAMUSCULAR | Status: AC
  Filled 2015-08-15: qty 4

## 2015-08-15 MED ORDER — LACTATED RINGERS IV SOLN
INTRAVENOUS | Status: DC
  Administered 2015-08-15: 11:00:00 via INTRAVENOUS

## 2015-08-15 MED ORDER — 0.9 % SODIUM CHLORIDE (POUR BTL) OPTIME
TOPICAL | Status: DC | PRN
  Administered 2015-08-15: 1000 mL

## 2015-08-15 MED ORDER — ONDANSETRON HCL 4 MG/2ML IJ SOLN
INTRAMUSCULAR | Status: DC | PRN
  Administered 2015-08-15: 4 mg via INTRAVENOUS

## 2015-08-15 MED ORDER — FENTANYL CITRATE (PF) 250 MCG/5ML IJ SOLN
INTRAMUSCULAR | Status: AC
  Filled 2015-08-15: qty 5

## 2015-08-15 MED ORDER — LIDOCAINE HCL (CARDIAC) 20 MG/ML IV SOLN
INTRAVENOUS | Status: AC
  Filled 2015-08-15: qty 5

## 2015-08-15 MED ORDER — BUPIVACAINE-EPINEPHRINE (PF) 0.5% -1:200000 IJ SOLN
INTRAMUSCULAR | Status: DC | PRN
  Administered 2015-08-15: 30 mL via PERINEURAL

## 2015-08-15 MED ORDER — PROPOFOL INFUSION 10 MG/ML OPTIME
INTRAVENOUS | Status: DC | PRN
  Administered 2015-08-15: 75 ug/kg/min via INTRAVENOUS

## 2015-08-15 MED ORDER — HYDROMORPHONE HCL 1 MG/ML IJ SOLN
INTRAMUSCULAR | Status: AC
  Administered 2015-08-15: 0.5 mg via INTRAVENOUS
  Filled 2015-08-15: qty 1

## 2015-08-15 MED ORDER — FENTANYL CITRATE (PF) 100 MCG/2ML IJ SOLN
INTRAMUSCULAR | Status: DC | PRN
  Administered 2015-08-15: 100 ug via INTRAVENOUS

## 2015-08-15 SURGICAL SUPPLY — 51 items
APL SKNCLS STERI-STRIP NONHPOA (GAUZE/BANDAGES/DRESSINGS)
BAG DECANTER FOR FLEXI CONT (MISCELLANEOUS) ×1 IMPLANT
BANDAGE ELASTIC 6 VELCRO ST LF (GAUZE/BANDAGES/DRESSINGS) ×1 IMPLANT
BENZOIN TINCTURE PRP APPL 2/3 (GAUZE/BANDAGES/DRESSINGS) ×1 IMPLANT
BNDG GAUZE ELAST 4 BULKY (GAUZE/BANDAGES/DRESSINGS) ×1 IMPLANT
CANISTER SUCTION 2500CC (MISCELLANEOUS) ×2 IMPLANT
CONT SPEC STER OR (MISCELLANEOUS) IMPLANT
COVER SURGICAL LIGHT HANDLE (MISCELLANEOUS) ×2 IMPLANT
DRAPE EXTREMITY T 121X128X90 (DRAPE) ×1 IMPLANT
DRAPE IMP U-DRAPE 54X76 (DRAPES) ×2 IMPLANT
DRAPE INCISE IOBAN 66X45 STRL (DRAPES) IMPLANT
DRAPE LAPAROSCOPIC ABDOMINAL (DRAPES) IMPLANT
DRAPE PED LAPAROTOMY (DRAPES) ×2 IMPLANT
DRAPE PROXIMA HALF (DRAPES) IMPLANT
DRSG ADAPTIC 3X8 NADH LF (GAUZE/BANDAGES/DRESSINGS) ×1 IMPLANT
DRSG PAD ABDOMINAL 8X10 ST (GAUZE/BANDAGES/DRESSINGS) ×2 IMPLANT
DRSG VAC ATS LRG SENSATRAC (GAUZE/BANDAGES/DRESSINGS) IMPLANT
DRSG VAC ATS MED SENSATRAC (GAUZE/BANDAGES/DRESSINGS) IMPLANT
DRSG VAC ATS SM SENSATRAC (GAUZE/BANDAGES/DRESSINGS) IMPLANT
ELECT CAUTERY BLADE 6.4 (BLADE) ×2 IMPLANT
ELECT REM PT RETURN 9FT ADLT (ELECTROSURGICAL) ×2
ELECTRODE REM PT RTRN 9FT ADLT (ELECTROSURGICAL) ×1 IMPLANT
GAUZE SPONGE 4X4 12PLY STRL (GAUZE/BANDAGES/DRESSINGS) ×1 IMPLANT
GLOVE BIO SURGEON STRL SZ 6.5 (GLOVE) ×4 IMPLANT
GOWN STRL REUS W/ TWL LRG LVL3 (GOWN DISPOSABLE) ×3 IMPLANT
GOWN STRL REUS W/TWL LRG LVL3 (GOWN DISPOSABLE) ×6
KIT BASIN OR (CUSTOM PROCEDURE TRAY) ×2 IMPLANT
KIT ROOM TURNOVER OR (KITS) ×2 IMPLANT
MATRIX SURGICAL PSM 7X10CM (Tissue) ×1 IMPLANT
MICROMATRIX 1000MG (Tissue) ×2 IMPLANT
NS IRRIG 1000ML POUR BTL (IV SOLUTION) ×2 IMPLANT
PACK GENERAL/GYN (CUSTOM PROCEDURE TRAY) ×2 IMPLANT
PACK ORTHO EXTREMITY (CUSTOM PROCEDURE TRAY) ×2 IMPLANT
PACK UNIVERSAL I (CUSTOM PROCEDURE TRAY) ×2 IMPLANT
PAD ABD 8X10 STRL (GAUZE/BANDAGES/DRESSINGS) ×1 IMPLANT
PAD ARMBOARD 7.5X6 YLW CONV (MISCELLANEOUS) ×3 IMPLANT
SOLUTION PARTIC MCRMTRX 1000MG (Tissue) IMPLANT
SPONGE GAUZE 4X4 12PLY STER LF (GAUZE/BANDAGES/DRESSINGS) ×1 IMPLANT
STAPLER VISISTAT 35W (STAPLE) ×2 IMPLANT
STOCKINETTE IMPERVIOUS 9X36 MD (GAUZE/BANDAGES/DRESSINGS) IMPLANT
STOCKINETTE IMPERVIOUS LG (DRAPES) IMPLANT
SURGILUBE 2OZ TUBE FLIPTOP (MISCELLANEOUS) ×1 IMPLANT
SUT SILK 4 0 SH CR/8 (SUTURE) ×1 IMPLANT
SUT VIC AB 5-0 PS2 18 (SUTURE) ×2 IMPLANT
SWAB COLLECTION DEVICE MRSA (MISCELLANEOUS) ×1 IMPLANT
TOWEL OR 17X24 6PK STRL BLUE (TOWEL DISPOSABLE) ×2 IMPLANT
TOWEL OR 17X26 10 PK STRL BLUE (TOWEL DISPOSABLE) ×2 IMPLANT
TUBE ANAEROBIC SPECIMEN COL (MISCELLANEOUS) ×1 IMPLANT
TUBE CONNECTING 12X1/4 (SUCTIONS) ×2 IMPLANT
UNDERPAD 30X30 INCONTINENT (UNDERPADS AND DIAPERS) ×2 IMPLANT
YANKAUER SUCT BULB TIP NO VENT (SUCTIONS) ×2 IMPLANT

## 2015-08-15 NOTE — Transfer of Care (Signed)
Immediate Anesthesia Transfer of Care Note  Patient: Shelby Lyons  Procedure(s) Performed: Procedure(s): IRRIGATION AND DEBRIDEMENT  OR RIGHT KNEE WOUND WITH PLACEMENTOF ACELL (Right) PLACMENT APPLICATION OF A-CELL (Right)  Patient Location: PACU  Anesthesia Type:MAC combined with regional for post-op pain  Level of Consciousness: awake, oriented and patient cooperative  Airway & Oxygen Therapy: Patient Spontanous Breathing and Patient connected to nasal cannula oxygen  Post-op Assessment: Report given to RN, Post -op Vital signs reviewed and stable and Patient moving all extremities  Post vital signs: Reviewed and stable  Last Vitals:  Filed Vitals:   08/15/15 1340  BP:   Pulse: 112  Temp:   Resp: 22    Complications: No apparent anesthesia complications

## 2015-08-15 NOTE — Discharge Instructions (Signed)
Apply KY gel to the right knee daily. Do not get the wound wet.

## 2015-08-15 NOTE — Interval H&P Note (Signed)
History and Physical Interval Note:  08/15/2015 1:19 PM  Shelby Lyons  has presented today for surgery, with the diagnosis of RIGHT KNEE WOUND  The various methods of treatment have been discussed with the patient and family. After consideration of risks, benefits and other options for treatment, the patient has consented to  Procedure(s): IRRIGATION AND DEBRIDEMENT  OR RIGHT KNEE WOUND WITH PLACEMENTOF ACELL (Right) PLACMENT APPLICATION OF A-CELL (Right) as a surgical intervention .  The patient's history has been reviewed, patient examined, no change in status, stable for surgery.  I have reviewed the patient's chart and labs.  Questions were answered to the patient's satisfaction.  Plavix and aspirin held today but not last night.   SANGER,Amelio Brosky

## 2015-08-15 NOTE — Anesthesia Procedure Notes (Signed)
Anesthesia Regional Block:  Femoral nerve block  Pre-Anesthetic Checklist: ,, timeout performed, Correct Patient, Correct Site, Correct Laterality, Correct Procedure, Correct Position, site marked, Risks and benefits discussed, pre-op evaluation,  At surgeon's request and post-op pain management  Laterality: Right  Prep: Maximum Sterile Barrier Precautions used and chloraprep       Needles:  Injection technique: Single-shot  Needle Type: Echogenic Stimulator Needle     Needle Length: 5cm 5 cm Needle Gauge: 22 and 22 G    Additional Needles:  Procedures: ultrasound guided (picture in chart) Femoral nerve block  Nerve Stimulator or Paresthesia:  Response: Patellar respose,   Additional Responses:   Narrative:  Start time: 08/15/2015 1:08 PM End time: 08/15/2015 1:18 PM Injection made incrementally with aspirations every 5 mL. Anesthesiologist: Gaynelle Adu  Additional Notes: 2% Lidocaine skin wheel.

## 2015-08-15 NOTE — Brief Op Note (Addendum)
08/15/2015  1:59 PM  PATIENT:  Shelby Lyons  64 y.o. female  PRE-OPERATIVE DIAGNOSIS:  RIGHT KNEE WOUND  POST-OPERATIVE DIAGNOSIS:  same  PROCEDURE:  Procedure(s): IRRIGATION AND DEBRIDEMENT  OR RIGHT KNEE WOUND WITH PLACEMENTOF ACELL (Right) PLACMENT APPLICATION OF A-CELL (Right)  SURGEON:  Surgeon(s) and Role:    * Jalaine Riggenbach Sanger, DO - Primary  PHYSICIAN ASSISTANT: Shawn Rayburn, PA  ASSISTANTS: none   ANESTHESIA:   general  EBL:     BLOOD ADMINISTERED:none  DRAINS: none   LOCAL MEDICATIONS USED:  NONE  SPECIMEN:  Micro right knee purulence and bone  DISPOSITION OF SPECIMEN:  N/A  COUNTS:  YES  TOURNIQUET:  * No tourniquets in log *  DICTATION: .Dragon Dictation  PLAN OF CARE: Discharge to home after PACU  PATIENT DISPOSITION:  PACU - hemodynamically stable.   Delay start of Pharmacological VTE agent (>24hrs) due to surgical blood loss or risk of bleeding: no

## 2015-08-15 NOTE — Anesthesia Postprocedure Evaluation (Signed)
  Anesthesia Post-op Note  Patient: Shelby Lyons  Procedure(s) Performed: Procedure(s): IRRIGATION AND DEBRIDEMENT  OR RIGHT KNEE WOUND WITH PLACEMENTOF ACELL (Right) PLACMENT APPLICATION OF A-CELL (Right)  Patient Location: PACU  Anesthesia Type:MAC combined with regional for post-op pain  Level of Consciousness: awake, alert , oriented and patient cooperative  Airway and Oxygen Therapy: Patient Spontanous Breathing  Post-op Pain: mild  Post-op Assessment: Post-op Vital signs reviewed, Patient's Cardiovascular Status Stable, Respiratory Function Stable, Patent Airway, No signs of Nausea or vomiting and Pain level controlled     RLE Motor Response: Purposeful movement, Responds to commands RLE Sensation: No tingling, No numbness, Decreased      Post-op Vital Signs: Reviewed and stable  Last Vitals:  Filed Vitals:   08/15/15 1555  BP: 120/65  Pulse: 85  Temp:   Resp: 12    Complications: No apparent anesthesia complications

## 2015-08-15 NOTE — Anesthesia Postprocedure Evaluation (Signed)
Anesthesia Post Note  Patient: Shelby Lyons  Procedure(s) Performed: Procedure(s) (LRB): IRRIGATION AND DEBRIDEMENT  OR RIGHT KNEE WOUND WITH PLACEMENTOF ACELL (Right) PLACMENT APPLICATION OF A-CELL (Right)  Anesthesia type: General  Patient location: PACU  Post pain: Pain level controlled  Post assessment: Post-op Vital signs reviewed  Last Vitals: BP 120/65 mmHg  Pulse 85  Temp(Src) 37.2 C (Oral)  Resp 12  Ht  (1.651 m)  SpO2 100%  Post vital signs: Reviewed  Level of consciousness: sedated  Complications: No apparent anesthesia complications

## 2015-08-15 NOTE — Anesthesia Preprocedure Evaluation (Addendum)
Anesthesia Evaluation  Patient identified by MRN, date of birth, ID band Patient awake    Reviewed: Allergy & Precautions, H&P , NPO status , Patient's Chart, lab work & pertinent test results  Airway Mallampati: III  TM Distance: >3 FB Neck ROM: Full    Dental no notable dental hx. (+) Teeth Intact, Dental Advisory Given   Pulmonary COPD,  COPD inhaler, former smoker,    Pulmonary exam normal breath sounds clear to auscultation       Cardiovascular hypertension, Pt. on medications + CAD   Rhythm:Regular Rate:Normal  S/p heart transplant   Neuro/Psych Anxiety Depression negative neurological ROS     GI/Hepatic Neg liver ROS, GERD  ,  Endo/Other  diabetes, Type 1, Insulin DependentHypothyroidism   Renal/GU Renal disease  negative genitourinary   Musculoskeletal  (+) Arthritis , Osteoarthritis,    Abdominal   Peds  Hematology negative hematology ROS (+)   Anesthesia Other Findings   Reproductive/Obstetrics negative OB ROS                            Anesthesia Physical Anesthesia Plan  ASA: III  Anesthesia Plan: MAC and Regional   Post-op Pain Management:    Induction: Intravenous  Airway Management Planned: Simple Face Mask  Additional Equipment:   Intra-op Plan:   Post-operative Plan:   Informed Consent: I have reviewed the patients History and Physical, chart, labs and discussed the procedure including the risks, benefits and alternatives for the proposed anesthesia with the patient or authorized representative who has indicated his/her understanding and acceptance.   Dental advisory given  Plan Discussed with: CRNA  Anesthesia Plan Comments:         Anesthesia Quick Evaluation

## 2015-08-15 NOTE — Op Note (Signed)
Operative Note   DATE OF OPERATION: 08/15/2015  LOCATION: [facility]   SURGICAL DIVISION: Plastic Surgery  PREOPERATIVE DIAGNOSES:  Right knee wound  POSTOPERATIVE DIAGNOSES:  same  PROCEDURE:  Preparation of right knee wound 7 x 7 cm and right leg 2 x 4 cm with placement of Acell (1 gm powder and 7 x 10 cm sheet)  SURGEON: Wayland Denis, DO  ASSISTANT: Shawn Rayburn, PA  ANESTHESIA:  General.   COMPLICATIONS: None.   INDICATIONS FOR PROCEDURE:  The patient, Shenelle Klas is a 64 y.o. female born on 05/18/1951, is here for treatment of a chronic right knee wound. MRN: [id number]  CONSENT:  Informed consent was obtained directly from the patient. Risks, benefits and alternatives were fully discussed. Specific risks including but not limited to bleeding, infection, hematoma, seroma, scarring, pain, infection, contracture, asymmetry, wound healing problems, and need for further surgery were all discussed. The patient did have an ample opportunity to have questions answered to satisfaction.   DESCRIPTION OF PROCEDURE:  The patient was taken to the operating room. SCDs were placed and IV antibiotics were given. The patient's operative site was prepped and draped in a sterile fashion. A time out was performed and all information was confirmed to be correct.  General anesthesia was administered.  The area was debrided with a #10 blade and currette.  The area was irrigated with normal saline and antibiotic ointment.  The more cephalad wound that was lateral and cephlad to the knee was sharply debrided and noted to have exposed bone.  A specimen of the bone (distal femur) was sent for gram stain culture and sensitivity.  After preparation of the two knee wounds the entire powder and 7 x 7 cm of the sheet was applied and secured with 5-0 Vicryl.  Adaptic and KY was applied.  There was a 2 x 4 cm wound on the right medial leg.  This was debrided and Acell sheet applied for the 2 x 4  cm portion of the exposed soft tissue.  An adaptic was secured with 4-0 Silk.  Once the clean portion of the case was done the pustule on the middle upper leg was opened and debrided.  It was irrigated with antibiotic solution and debrided with a # 10 blade.  The area was then packed with nu gauze.  Gram stain / cultures and sensitivity was sent of the purulence.  The leg was then wrapped with kerlex and an ace wrap. The patient tolerated the procedure well.  There were no complications. The patient was allowed to wake from anesthesia, extubated and taken to the recovery room in satisfactory condition.

## 2015-08-15 NOTE — Interval H&P Note (Signed)
History and Physical Interval Note:  08/15/2015 1:01 PM  Shelby Lyons  has presented today for surgery, with the diagnosis of RIGHT KNEE WOUND  The various methods of treatment have been discussed with the patient and family. After consideration of risks, benefits and other options for treatment, the patient has consented to  Procedure(s): IRRIGATION AND DEBRIDEMENT  OR RIGHT KNEE WOUND WITH PLACEMENTOF ACELL (Right) PLACMENT APPLICATION OF A-CELL (Right) as a surgical intervention .  The patient's history has been reviewed, patient examined, no change in status, stable for surgery.  I have reviewed the patient's chart and labs.  Questions were answered to the patient's satisfaction.     SANGER,Cherri Yera

## 2015-08-15 NOTE — H&P (Signed)
Shelby Lyons is an 64 y.o. female.   Chief Complaint: right knee wound HPI: The patient is a 64 yrs old female here for treatment of her right knee.  She had an injury with an open wound.  She is doing much better but is slow to heal.  She is a heart transplant patient and will hold the anticoagulation today.   Past Medical History  Diagnosis Date  . CAD (coronary artery disease)     a. antlat MI 1994;  b. 9/05 Lat MI - DES LCX & RCA w/ subseq BMS to LCX 2/2 dissection;  c. 04/2005 DES to RCA;  d. 12/2010 CABG x 3: LIMA->LAD, VG->LCX, VG->PDA;  d. Myoview 03/2012 Antsept/inflat AK w/ fixed defects in those areas.  No ischemia.  EF 29% w/ global HK.  . Bradycardia     after cardioversion   . HTN (hypertension)   . Chronic combined systolic and diastolic CHF (congestive heart failure)     a. 10/2011 Echo:  EF 55-60% Antsept AK, Gr4 DD, irreversible restrictive pattern, Mod Dil LA;  03/2012 Echo: EF 45%, dist inf/infsept/ap HK, mod MR, mod dil LA, PASP  . Anxiety   . GERD (gastroesophageal reflux disease)   . Allergic rhinitis   . Obesity   . Abdominal pain, acute, right upper quadrant     a. GB dzs - pending cholecystectomy  . Microcytic anemia     a. s/p 1 u prbc's 04/2012; b. Colonoscopy 5/13 "okay"  . Heart transplanted   . DM2 (diabetes mellitus, type 2)   . Pneumonia   . COPD (chronic obstructive pulmonary disease)   . Hypothyroidism   . Depression   . Kidney stones   . History of hiatal hernia   . Neuropathy   . Arthritis   . IBS (irritable bowel syndrome)   . Diabetic retinopathy     Past Surgical History  Procedure Laterality Date  . Ptca of the lad  1994  . Cardiac catheterization  Jan 1998  . Coronary angiography  Nov 2005    with PTCA placement of bare-metal stents x 2 in the mild and distal left circumflex artery-  . Appendectomy    . Hysterectomy (other)  1998  . Bilateral salpingoophorectomy  1998  . Eye surgery Bilateral     cataract surgery with lens  implant  . Heart transplant  July/2015    done at [facility]  . Coronary artery bypass graft  12/2010    prior to transplant  . Cholecystectomy    . Colonoscopy      Family History  Problem Relation Age of Onset  . Diabetes Mother   . Hyperlipidemia Mother   . Heart attack Mother   . CVA Mother   . Heart attack Father   . Hypertension Sister   . Hypertension Sister    Social History:  reports that she quit smoking about 34 years ago. She has never used smokeless tobacco. She reports that she does not drink alcohol or use illicit drugs.  Allergies:  Allergies  Allergen Reactions  . Ativan [Lorazepam] Other (See Comments)     Hallucination  . Dronabinol Other (See Comments)    Hallucinations (reaction to Marinol)  . Melatonin Other (See Comments)    Caused depression, lethargy  . Nsaids Other (See Comments)    Transplant pt  . Penicillins Itching  . Sulfa Antibiotics Hives  . Tape Rash    Use adhesive tape cautiously    No prescriptions  prior to admission    No results found for this or any previous visit (from the past 48 hour(s)). No results found.  Review of Systems  Constitutional: Negative.   HENT: Negative.   Eyes: Negative.   Respiratory: Negative.   Cardiovascular: Negative.   Gastrointestinal: Negative.   Genitourinary: Negative.   Musculoskeletal: Negative.   Skin: Negative.   Neurological: Negative.   Psychiatric/Behavioral: Negative.     There were no vitals taken for this visit. Physical Exam  Constitutional: She is oriented to person, place, and time. She appears well-developed and well-nourished.  HENT:  Head: Normocephalic and atraumatic.  Eyes: Conjunctivae and EOM are normal. Pupils are equal, round, and reactive to light.  Cardiovascular: Normal rate.   Respiratory: Effort normal.  GI: Soft.  Neurological: She is alert and oriented to person, place, and time.  Skin: Skin is warm.  Psychiatric: She has a normal mood and affect. Her  behavior is normal. Judgment and thought content normal.     Assessment/Plan Debridement of right knee with placement of Acell.  SANGER,CLAIRE 08/15/2015, 8:11 AM

## 2015-08-15 NOTE — Progress Notes (Signed)
HR is rising up into the 112 range.  Dr. Sampson Goon aware.   DA

## 2015-08-16 ENCOUNTER — Encounter (HOSPITAL_COMMUNITY): Payer: Self-pay | Admitting: Plastic Surgery

## 2015-08-18 LAB — WOUND CULTURE: CULTURE: NO GROWTH

## 2015-08-19 LAB — TISSUE CULTURE: CULTURE: NO GROWTH

## 2015-08-20 ENCOUNTER — Encounter (HOSPITAL_BASED_OUTPATIENT_CLINIC_OR_DEPARTMENT_OTHER): Payer: BC Managed Care – PPO | Attending: Plastic Surgery

## 2015-08-20 LAB — ANAEROBIC CULTURE

## 2015-08-22 ENCOUNTER — Encounter: Payer: Self-pay | Admitting: Endocrinology

## 2015-08-22 ENCOUNTER — Ambulatory Visit (INDEPENDENT_AMBULATORY_CARE_PROVIDER_SITE_OTHER): Payer: BC Managed Care – PPO | Admitting: Endocrinology

## 2015-08-22 VITALS — BP 88/54 | HR 91 | Temp 98.4°F | Ht 65.0 in | Wt 168.0 lb

## 2015-08-22 DIAGNOSIS — E118 Type 2 diabetes mellitus with unspecified complications: Secondary | ICD-10-CM

## 2015-08-22 LAB — POCT GLYCOSYLATED HEMOGLOBIN (HGB A1C): HEMOGLOBIN A1C: 10.4

## 2015-08-22 MED ORDER — INSULIN ASPART 100 UNIT/ML FLEXPEN: PEN_INJECTOR | SUBCUTANEOUS | Status: AC

## 2015-08-22 MED ORDER — INSULIN GLARGINE 100 UNITS/ML SOLOSTAR PEN: 22.0000 [IU] | PEN_INJECTOR | Freq: Every day | SUBCUTANEOUS | Status: AC

## 2015-08-22 NOTE — Patient Instructions (Addendum)
good diet and exercise significantly improve the control of your diabetes.  please let me know if you wish to be referred to a dietician.  high blood sugar is very risky to your health.  you should see an eye doctor and dentist every year.  It is very important to get all recommended vaccinations.  controlling your blood pressure and cholesterol drastically reduces the damage diabetes does to your body.  Those who smoke should quit.  please discuss these with your doctor.   check your blood sugar 4 times a day.  vary the time of day when you check, between before the 3 meals, and at bedtime.  also check if you have symptoms of your blood sugar being too high or too low.  please keep a record of the readings and bring it to your next appointment here.  You can write it on any piece of paper.  please call us sooner if your blood sugar goes below 70, or if you have a lot of readings over 200.   For now, please reduce the lantus to 20 units daily, and: Change the novolog to 3 times a day (just before each meal) 11-23-16 units.  You do not have to eat a snack at bedtime, but if you do, take 2 units with it.   Please come back for a follow-up appointment in 2 months.  Please call us next week, to tell us how the blood sugar is doing.

## 2015-08-22 NOTE — Progress Notes (Signed)
Subjective:    Patient ID: Shelby Lyons, female    DOB: [birth year], 64 y.o.   MRN: [id number]  HPI pt states DM was dx'ed in 1983; she has mild neuropathy of the lower extremities; she has associated CAD, renal insufficiency, and retinopathy; he has been on insulin since 1994; pt says her diet is poor, and exercise is limited by health problems; she has never had GDM, pancreatitis, severe hypoglycemia or DKA.  She will finish prednisone in 1 month.  She takes multiple daily injections.  She has mild hypoglycemia several times per week, usually in the middle of the night.   Past Medical History  Diagnosis Date  . CAD (coronary artery disease)     a. antlat MI 1994;  b. 9/05 Lat MI - DES LCX & RCA w/ subseq BMS to LCX 2/2 dissection;  c. 04/2005 DES to RCA;  d. 12/2010 CABG x 3: LIMA->LAD, VG->LCX, VG->PDA;  d. Myoview 03/2012 Antsept/inflat AK w/ fixed defects in those areas.  No ischemia.  EF 29% w/ global HK.  . Bradycardia     after cardioversion   . HTN (hypertension)   . Chronic combined systolic and diastolic CHF (congestive heart failure)     a. 10/2011 Echo:  EF 55-60% Antsept AK, Gr4 DD, irreversible restrictive pattern, Mod Dil LA;  03/2012 Echo: EF 45%, dist inf/infsept/ap HK, mod MR, mod dil LA, PASP  . Anxiety   . GERD (gastroesophageal reflux disease)   . Allergic rhinitis   . Obesity   . Abdominal pain, acute, right upper quadrant     a. GB dzs - pending cholecystectomy  . Microcytic anemia     a. s/p 1 u prbc's 04/2012; b. Colonoscopy 5/13 "okay"  . Heart transplanted   . DM2 (diabetes mellitus, type 2)   . Pneumonia   . COPD (chronic obstructive pulmonary disease)   . Hypothyroidism   . Depression   . Kidney stones   . History of hiatal hernia   . Neuropathy   . Arthritis   . IBS (irritable bowel syndrome)   . Diabetic retinopathy     Past Surgical History  Procedure Laterality Date  . Ptca of the lad  1994  . Cardiac catheterization  Jan 1998  .  Coronary angiography  Nov 2005    with PTCA placement of bare-metal stents x 2 in the mild and distal left circumflex artery-  . Appendectomy    . Hysterectomy (other)  1998  . Bilateral salpingoophorectomy  1998  . Eye surgery Bilateral     cataract surgery with lens implant  . Heart transplant  July/2015    done at [facility]  . Coronary artery bypass graft  12/2010    prior to transplant  . Cholecystectomy    . Colonoscopy    . Incision and drainage of wound Right 08/15/2015    Procedure: IRRIGATION AND DEBRIDEMENT  OR RIGHT KNEE WOUND WITH PLACEMENTOF ACELL;  Surgeon: Wayland Denis, DO;  Location: [facility] OR;  Service: Plastics;  Laterality: Right;  . Application of a-cell of extremity Right 08/15/2015    Procedure: PLACMENT APPLICATION OF A-CELL;  Surgeon: Wayland Denis, DO;  Location: [facility] OR;  Service: Plastics;  Laterality: Right;    Social History   Social History  . Marital Status: Married    Spouse Name: N/A  . Number of Children: N/A  . Years of Education: N/A   Occupational History  . Not on file.   Social History Main  Topics  . Smoking status: Former Smoker    Quit date: 08/09/1981  . Smokeless tobacco: Never Used  . Alcohol Use: No  . Drug Use: No  . Sexual Activity: Not on file   Other Topics Concern  . Not on file   Social History Narrative   Lives in [place].     Current Outpatient Prescriptions on File Prior to Visit  Medication Sig Dispense Refill  . acetaminophen (TYLENOL) 500 MG tablet Take 1,000 mg by mouth every 8 (eight) hours as needed (pain).    Marland Kitchen albuterol (PROAIR HFA) 108 (90 BASE) MCG/ACT inhaler Inhale 2 puffs into the lungs every 6 (six) hours as needed for wheezing or shortness of breath.     Marland Kitchen aspirin EC 81 MG tablet Take 81 mg by mouth daily.    . budesonide-formoterol (SYMBICORT) 80-4.5 MCG/ACT inhaler Inhale 2 puffs into the lungs 2 (two) times daily.    . busPIRone (BUSPAR) 5 MG tablet Take 5 mg by mouth 2 (two) times daily.    . Calcium  Carb-Cholecalciferol (CALCIUM 600 + D PO) Take 1 tablet by mouth 2 (two) times daily.    . ciprofloxacin (CIPRO) 500 MG tablet Take 1 tablet (500 mg total) by mouth 2 (two) times daily. 20 tablet 0  . clopidogrel (PLAVIX) 75 MG tablet Take 75 mg by mouth at bedtime.     . fluconazole (DIFLUCAN) 200 MG tablet Take 200 mg by mouth daily.    . fluticasone (FLONASE) 50 MCG/ACT nasal spray Place 2 sprays into both nostrils 2 (two) times daily as needed (congestion/ dry nasal passages).    . gabapentin (NEURONTIN) 100 MG capsule Take 200 mg by mouth 2 (two) times daily.     . hydrALAZINE (APRESOLINE) 25 MG tablet Take 25 mg by mouth 2 (two) times daily.    Marland Kitchen levothyroxine (SYNTHROID, LEVOTHROID) 100 MCG tablet Take 100 mcg by mouth daily.    . minocycline (MINOCIN,DYNACIN) 100 MG capsule Take 100 mg by mouth 2 (two) times daily.    . mirtazapine (REMERON) 15 MG tablet Take 15 mg by mouth at bedtime.     . Multiple Vitamin (MULTIVITAMIN WITH MINERALS) TABS tablet Take 1 tablet by mouth daily. Women's 50 plus    . mycophenolate (CELLCEPT) 500 MG tablet Take 500 mg by mouth 2 (two) times daily.     . nitroGLYCERIN (NITROSTAT) 0.4 MG SL tablet Place 1 tablet (0.4 mg total) under the tongue every 5 (five) minutes as needed. 25 tablet 6  . pantoprazole (PROTONIX) 40 MG tablet Take 40 mg by mouth daily.    . pravastatin (PRAVACHOL) 40 MG tablet Take 40 mg by mouth at bedtime.     . predniSONE (DELTASONE) 2.5 MG tablet Take 2.5 mg by mouth daily.     Marland Kitchen rOPINIRole (REQUIP) 2 MG tablet Take 2 mg by mouth at bedtime.    . sertraline (ZOLOFT) 50 MG tablet Take 150 mg by mouth daily.     . tacrolimus (PROGRAF) 1 MG capsule Take 1 mg by mouth 2 (two) times daily.     . traMADol (ULTRAM) 50 MG tablet Take 50 mg by mouth daily as needed (knee pain).     No current facility-administered medications on file prior to visit.    Allergies  Allergen Reactions  . Ativan [Lorazepam] Other (See Comments)      Hallucination  . Dronabinol Other (See Comments)    Hallucinations (reaction to Marinol)  . Melatonin Other (See Comments)  Caused depression, lethargy  . Nsaids Other (See Comments)    Transplant pt  . Penicillins Itching  . Sulfa Antibiotics Hives  . Tape Rash    Use adhesive tape cautiously    Family History  Problem Relation Age of Onset  . Diabetes Mother   . Hyperlipidemia Mother   . Heart attack Mother   . CVA Mother   . Heart attack Father   . Hypertension Sister   . Hypertension Sister     BP 88/54 mmHg  Pulse 91  Temp(Src) 98.4 F (36.9 C) (Oral)  Ht 5\' 5"  (1.651 m)  Wt 168 lb (76.204 kg)  BMI 27.96 kg/m2  SpO2 93%    Review of Systems denies weight loss, blurry vision, headache, chest pain, sob, n/v, urinary frequency, muscle cramps, excessive diaphoresis, and memory loss.  She has lost more than 100 lbs.  She has rhinorrhea, cold intolerance, and easy bruising.      Objective:   Physical Exam VS: see vs page GEN: no distress.  In wheelchair. HEAD: head: no deformity eyes: no periorbital swelling, no proptosis external nose and ears are normal mouth: no lesion seen NECK: supple, thyroid is not enlarged CHEST WALL: no deformity LUNGS:  Clear to auscultation.  CV: reg rate and rhythm, no murmur ABD: abdomen is soft, nontender.  no hepatosplenomegaly.  not distended.  no hernia. Redundant skin is noted MUSCULOSKELETAL: muscle bulk and strength are grossly normal.  no obvious joint swelling.  gait is normal and steady EXTEMITIES: no deformity.  no ulcer on the feet.  feet are of normal color and temp.  no edema PULSES: dorsalis pedis intact bilat.  no carotid bruit NEURO:  cn 2-12 grossly intact.   readily moves all 4's.  sensation is intact to touch on the feet SKIN:  Normal texture and temperature.  No rash or suspicious lesion is visible.   NODES:  None palpable at the neck.   PSYCH: alert, well-oriented.  Does not appear anxious nor depressed.    A1c=10.4%  I have reviewed outside records, and summarized: Pt was seen, and noted to have very poor overall heath state  i personally reviewed electrocardiogram tracing (05/14/15): Indication: DM Impression: bifascicular block    Assessment & Plan:  DM: uncertain type (she has factors favoring type and type 2), severe exacerbation.  Based on the pattern of her cbg's, she needs some adjustment in her therapy  Patient is advised the following: Patient Instructions  good diet and exercise significantly improve the control of your diabetes.  please let me know if you wish to be referred to a dietician.  high blood sugar is very risky to your health.  you should see an eye doctor and dentist every year.  It is very important to get all recommended vaccinations.  controlling your blood pressure and cholesterol drastically reduces the damage diabetes does to your body.  Those who smoke should quit.  please discuss these with your doctor.   check your blood sugar 4 times a day.  vary the time of day when you check, between before the 3 meals, and at bedtime.  also check if you have symptoms of your blood sugar being too high or too low.  please keep a record of the readings and bring it to your next appointment here.  You can write it on any piece of paper.  please call us sooner if your blood sugar goes below 70, or if you have a lot of readings over  200.   For now, please reduce the lantus to 20 units daily, and: Change the novolog to 3 times a day (just before each meal) 11-23-16 units.  You do not have to eat a snack at bedtime, but if you do, take 2 units with it.   Please come back for a follow-up appointment in 2 months.  Please call us next week, to tell us how the blood sugar is doing.

## 2015-08-29 ENCOUNTER — Encounter (HOSPITAL_COMMUNITY): Admission: RE | Payer: Self-pay | Source: Ambulatory Visit

## 2015-08-29 ENCOUNTER — Ambulatory Visit (HOSPITAL_COMMUNITY): Admission: RE | Admit: 2015-08-29 | Payer: BC Managed Care – PPO | Source: Ambulatory Visit | Admitting: Plastic Surgery

## 2015-08-29 SURGERY — IRRIGATION AND DEBRIDEMENT WOUND
Anesthesia: General | Laterality: Right

## 2015-10-22 ENCOUNTER — Ambulatory Visit: Payer: BC Managed Care – PPO | Admitting: Endocrinology

## 2015-10-22 DIAGNOSIS — Z0289 Encounter for other administrative examinations: Secondary | ICD-10-CM

## 2018-01-13 DIAGNOSIS — R4182 Altered mental status, unspecified: Secondary | ICD-10-CM | POA: Diagnosis not present

## 2018-01-13 DIAGNOSIS — I6789 Other cerebrovascular disease: Secondary | ICD-10-CM | POA: Diagnosis not present

## 2018-01-13 DIAGNOSIS — A419 Sepsis, unspecified organism: Secondary | ICD-10-CM | POA: Diagnosis not present

## 2018-02-19 ENCOUNTER — Other Ambulatory Visit: Payer: Self-pay | Admitting: Physical Medicine & Rehabilitation

## 2018-03-04 DIAGNOSIS — E440 Moderate protein-calorie malnutrition: Secondary | ICD-10-CM

## 2018-03-04 DIAGNOSIS — K859 Acute pancreatitis without necrosis or infection, unspecified: Secondary | ICD-10-CM

## 2018-03-04 DIAGNOSIS — I4891 Unspecified atrial fibrillation: Secondary | ICD-10-CM | POA: Diagnosis not present

## 2018-03-04 DIAGNOSIS — I2510 Atherosclerotic heart disease of native coronary artery without angina pectoris: Secondary | ICD-10-CM

## 2018-03-04 DIAGNOSIS — N390 Urinary tract infection, site not specified: Secondary | ICD-10-CM

## 2018-03-05 DIAGNOSIS — I2510 Atherosclerotic heart disease of native coronary artery without angina pectoris: Secondary | ICD-10-CM | POA: Diagnosis not present

## 2018-03-05 DIAGNOSIS — K859 Acute pancreatitis without necrosis or infection, unspecified: Secondary | ICD-10-CM | POA: Diagnosis not present

## 2018-03-05 DIAGNOSIS — N390 Urinary tract infection, site not specified: Secondary | ICD-10-CM | POA: Diagnosis not present

## 2018-03-05 DIAGNOSIS — E440 Moderate protein-calorie malnutrition: Secondary | ICD-10-CM | POA: Diagnosis not present

## 2018-03-06 DIAGNOSIS — N390 Urinary tract infection, site not specified: Secondary | ICD-10-CM | POA: Diagnosis not present

## 2018-03-06 DIAGNOSIS — E440 Moderate protein-calorie malnutrition: Secondary | ICD-10-CM | POA: Diagnosis not present

## 2018-03-06 DIAGNOSIS — I2510 Atherosclerotic heart disease of native coronary artery without angina pectoris: Secondary | ICD-10-CM | POA: Diagnosis not present

## 2018-03-06 DIAGNOSIS — K859 Acute pancreatitis without necrosis or infection, unspecified: Secondary | ICD-10-CM | POA: Diagnosis not present

## 2018-03-07 DIAGNOSIS — K859 Acute pancreatitis without necrosis or infection, unspecified: Secondary | ICD-10-CM | POA: Diagnosis not present

## 2018-03-07 DIAGNOSIS — I2510 Atherosclerotic heart disease of native coronary artery without angina pectoris: Secondary | ICD-10-CM | POA: Diagnosis not present

## 2018-03-07 DIAGNOSIS — E440 Moderate protein-calorie malnutrition: Secondary | ICD-10-CM | POA: Diagnosis not present

## 2018-03-07 DIAGNOSIS — N390 Urinary tract infection, site not specified: Secondary | ICD-10-CM | POA: Diagnosis not present

## 2018-03-08 DIAGNOSIS — N390 Urinary tract infection, site not specified: Secondary | ICD-10-CM | POA: Diagnosis not present

## 2018-03-08 DIAGNOSIS — E440 Moderate protein-calorie malnutrition: Secondary | ICD-10-CM | POA: Diagnosis not present

## 2018-03-08 DIAGNOSIS — K859 Acute pancreatitis without necrosis or infection, unspecified: Secondary | ICD-10-CM | POA: Diagnosis not present

## 2018-03-08 DIAGNOSIS — I2510 Atherosclerotic heart disease of native coronary artery without angina pectoris: Secondary | ICD-10-CM | POA: Diagnosis not present

## 2018-03-09 DIAGNOSIS — K859 Acute pancreatitis without necrosis or infection, unspecified: Secondary | ICD-10-CM | POA: Diagnosis not present

## 2018-03-10 DIAGNOSIS — K859 Acute pancreatitis without necrosis or infection, unspecified: Secondary | ICD-10-CM | POA: Diagnosis not present

## 2018-11-07 DEATH — deceased

## 2018-12-08 DEATH — deceased
